# Patient Record
Sex: Female | Born: 2000 | Race: Black or African American | Hispanic: No | Marital: Single | Smoking: Never smoker
Health system: Southern US, Community
[De-identification: ages and names within clinical notes are randomized; demographics above are authoritative.]

## PROBLEM LIST (undated history)

## (undated) DIAGNOSIS — L309 Dermatitis, unspecified: Secondary | ICD-10-CM

## (undated) DIAGNOSIS — L509 Urticaria, unspecified: Secondary | ICD-10-CM

## (undated) HISTORY — DX: Urticaria, unspecified: L50.9

## (undated) HISTORY — DX: Dermatitis, unspecified: L30.9

---
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
## 2019-04-01 ENCOUNTER — Other Ambulatory Visit: Payer: Self-pay

## 2019-04-01 ENCOUNTER — Emergency Department (HOSPITAL_COMMUNITY): Payer: No Typology Code available for payment source

## 2019-04-01 ENCOUNTER — Emergency Department (HOSPITAL_COMMUNITY)
Admission: EM | Admit: 2019-04-01 | Discharge: 2019-04-02 | Disposition: A | Discharge status: Home / Self Care | Payer: No Typology Code available for payment source | Attending: Emergency Medicine | Admitting: Emergency Medicine

## 2019-04-01 ENCOUNTER — Encounter (HOSPITAL_COMMUNITY): Payer: Self-pay | Admitting: Emergency Medicine

## 2019-04-01 DIAGNOSIS — M79602 Pain in left arm: Secondary | ICD-10-CM | POA: Diagnosis not present

## 2019-04-01 DIAGNOSIS — Y92410 Unspecified street and highway as the place of occurrence of the external cause: Secondary | ICD-10-CM | POA: Diagnosis not present

## 2019-04-01 DIAGNOSIS — Y9389 Activity, other specified: Secondary | ICD-10-CM | POA: Diagnosis not present

## 2019-04-01 DIAGNOSIS — S0990XA Unspecified injury of head, initial encounter: Secondary | ICD-10-CM | POA: Diagnosis present

## 2019-04-01 DIAGNOSIS — Y999 Unspecified external cause status: Secondary | ICD-10-CM | POA: Insufficient documentation

## 2019-04-01 DIAGNOSIS — S0003XA Contusion of scalp, initial encounter: Secondary | ICD-10-CM

## 2019-04-01 MED ORDER — METOCLOPRAMIDE HCL 10 MG PO TABS
10.0000 mg | ORAL_TABLET | Freq: Once | ORAL | Status: AC
Start: 2019-04-01 — End: 2019-04-01
  Administered 2019-04-01: 23:00:00 10 mg via ORAL
  Filled 2019-04-01: qty 1

## 2019-04-01 MED ORDER — NAPROXEN 250 MG PO TABS
500.0000 mg | ORAL_TABLET | Freq: Once | ORAL | Status: AC
  Administered 2019-04-01: 23:00:00 500 mg via ORAL
  Filled 2019-04-01: qty 2
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
## 2019-04-02 NOTE — Discharge Instructions (Addendum)
You had a normal neurologic exam while in the emergency department.  This is reassuring.  It is possible that you may have a mild concussion.  A concussion is a diagnosis that is made clinically and does not show any abnormal results on imaging such as a CT scan or MRI.  Your CT today showed a hematoma to your scalp. This will improve with icing and time. Ibuprofen or tylenol may help improve your pain. Take as prescribed on the box/bottle.  A concussion may cause a persistent headache over the next few days. This can be brought on or worsened by loud sounds or bright lights. Try to avoid excessive use of cell phones, television, video games as this may worsening headaches.  Avoid strenuous activity and heavy lifting over the next few days.  If you develop severe worsening of your headache, vision changes or loss, uncontrolled vomiting, numbness or tingling to one side of your body, difficulty walking or lifting your arms or legs, return promptly to the emergency department for repeat evaluation.

## 2019-04-03 ENCOUNTER — Encounter (HOSPITAL_COMMUNITY): Payer: Self-pay | Admitting: Emergency Medicine

## 2019-04-03 ENCOUNTER — Other Ambulatory Visit: Payer: Self-pay

## 2019-04-03 ENCOUNTER — Emergency Department (HOSPITAL_COMMUNITY)
Admission: EM | Admit: 2019-04-03 | Discharge: 2019-04-03 | Disposition: A | Discharge status: Home / Self Care | Payer: PRIVATE HEALTH INSURANCE | Attending: Emergency Medicine | Admitting: Emergency Medicine

## 2019-04-03 ENCOUNTER — Emergency Department (HOSPITAL_COMMUNITY): Payer: PRIVATE HEALTH INSURANCE

## 2019-04-03 DIAGNOSIS — M546 Pain in thoracic spine: Secondary | ICD-10-CM | POA: Insufficient documentation

## 2019-04-03 DIAGNOSIS — M545 Low back pain: Secondary | ICD-10-CM | POA: Insufficient documentation

## 2019-04-03 DIAGNOSIS — Y999 Unspecified external cause status: Secondary | ICD-10-CM | POA: Insufficient documentation

## 2019-04-03 DIAGNOSIS — Y9389 Activity, other specified: Secondary | ICD-10-CM | POA: Diagnosis not present

## 2019-04-03 DIAGNOSIS — Y92410 Unspecified street and highway as the place of occurrence of the external cause: Secondary | ICD-10-CM | POA: Diagnosis not present

## 2019-04-03 DIAGNOSIS — R079 Chest pain, unspecified: Secondary | ICD-10-CM | POA: Diagnosis not present

## 2019-04-03 MED ORDER — METHOCARBAMOL 500 MG PO TABS: 500.0000 mg | ORAL_TABLET | Freq: Two times a day (BID) | ORAL | 0 refills | Status: DC

## 2019-04-03 MED ORDER — NAPROXEN 500 MG PO TABS: 500.0000 mg | ORAL_TABLET | Freq: Two times a day (BID) | ORAL | 0 refills | Status: DC

## 2019-04-03 NOTE — Discharge Instructions (Signed)
Continue the medications as prescribed. Return to the ED for worsening symptoms, increased chest pain, leg swelling, losing control of your bowels or bladder injuries or falls.
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
## 2019-07-12 ENCOUNTER — Encounter (HOSPITAL_COMMUNITY): Payer: Self-pay

## 2019-07-12 ENCOUNTER — Ambulatory Visit (HOSPITAL_COMMUNITY)
Admission: EM | Admit: 2019-07-12 | Discharge: 2019-07-12 | Disposition: A | Discharge status: Home / Self Care | Payer: Self-pay | Attending: Family Medicine | Admitting: Family Medicine

## 2019-07-12 ENCOUNTER — Other Ambulatory Visit: Payer: Self-pay

## 2019-07-12 DIAGNOSIS — R030 Elevated blood-pressure reading, without diagnosis of hypertension: Secondary | ICD-10-CM

## 2019-07-12 DIAGNOSIS — K0889 Other specified disorders of teeth and supporting structures: Secondary | ICD-10-CM

## 2019-07-12 MED ORDER — HYDROCODONE-ACETAMINOPHEN 5-325 MG PO TABS: 1.0000 | ORAL_TABLET | Freq: Four times a day (QID) | ORAL | 0 refills | Status: DC | PRN

## 2019-07-12 MED ORDER — PENICILLIN V POTASSIUM 500 MG PO TABS: 500.0000 mg | ORAL_TABLET | Freq: Three times a day (TID) | ORAL | 0 refills | Status: DC

## 2019-07-12 NOTE — Discharge Instructions (Addendum)
Be aware, you have been prescribed pain medications that may cause drowsiness. While taking this medication, do not take any other medications containing acetaminophen (Tylenol). Do not combine with alcohol or other illicit drugs. Please do not drive, operate heavy machinery, or take part in activities that require making important decisions while on this medication as your judgement may be clouded.  Your blood pressure was noted to be elevated during your visit today. You may return here within the next few days to recheck if unable to see your primary care doctor. If your blood pressure remains persistently elevated, you may need to begin taking a medication.  BP (!) 149/91 (BP Location: Right Arm)   Pulse 78   Temp 98.6 F (37 C) (Oral)   Resp 18   SpO2 99%

## 2019-07-12 NOTE — ED Triage Notes (Signed)
Patient presents to Urgent Care with complaints of right sided dental pain since the past few days. Patient reports she has a dental appt in the morning but cannot wait that long for relief.

## 2019-07-13 NOTE — ED Provider Notes (Signed)
[place]   [id number] 07/12/19 Arrival Time: 3474  ASSESSMENT & PLAN:  1. Pain, dental   2. Elevated blood pressure reading without diagnosis of hypertension     No sign of abscess requiring I&D at this time. Discussed.  Meds ordered this encounter  Medications  . penicillin v potassium (VEETID) 500 MG tablet    Sig: Take 1 tablet (500 mg total) by mouth 3 (three) times daily.    Dispense:  30 tablet    Refill:  0  . HYDROcodone-acetaminophen (NORCO/VICODIN) 5-325 MG tablet    Sig: Take 1 tablet by mouth every 6 (six) hours as needed for moderate pain or severe pain.    Dispense:  6 tablet    Refill:  0    [place] Controlled Substances Registry consulted for this patient. I feel the risk/benefit ratio today is favorable for proceeding with this prescription for a controlled substance. Medication sedation precautions given.  Dental resource written instructions given. She will schedule dental evaluation as soon as possible if not improving over the next 24-48 hours.    Discharge Instructions     Be aware, you have been prescribed pain medications that may cause drowsiness. While taking this medication, do not take any other medications containing acetaminophen (Tylenol). Do not combine with alcohol or other illicit drugs. Please do not drive, operate heavy machinery, or take part in activities that require making important decisions while on this medication as your judgement may be clouded.  Your blood pressure was noted to be elevated during your visit today. You may return here within the next few days to recheck if unable to see your primary care doctor. If your blood pressure remains persistently elevated, you may need to begin taking a medication.  BP (!) 149/91 (BP Location: Right Arm)   Pulse 78   Temp 98.6 F (37 C) (Oral)   Resp 18   SpO2 99%       Reviewed expectations re: course of current medical issues. Questions answered. Outlined signs and  symptoms indicating need for more acute intervention. Patient verbalized understanding. After Visit Summary given.   SUBJECTIVE:  Nichole Garrett is a 19 y.o. female who reports gradual onset of right lower dental pain described as aching/throbbing. Present for few days. Fever: absent. Tolerating PO intake but reports pain with chewing. Normal swallowing. She does not see a dentist regularly. No neck swelling or pain. OTC analgesics without relief.  Increased blood pressure noted today. Reports that she has not been treated for hypertension in the past.  She reports no chest pain on exertion, no dyspnea on exertion, no swelling of ankles, no orthostatic dizziness or lightheadedness, no orthopnea or paroxysmal nocturnal dyspnea, no palpitations and no intermittent claudication symptoms.   OBJECTIVE: Vitals:   07/12/19 1333  BP: (!) 149/91  Pulse: 78  Resp: 18  Temp: 98.6 F (37 C)  TempSrc: Oral  SpO2: 99%    General appearance: alert; no distress HENT: normocephalic; atraumatic; dentition: fair; right lower gums without areas of fluctuance, drainage, or bleeding and with tenderness to palpation; normal jaw movement without difficulty Neck: supple without LAD; FROM; trachea midline Lungs: normal respirations; unlabored Skin: warm and dry Psychological: alert and cooperative; normal mood and affect  No Known Allergies  History reviewed. No pertinent past medical history.   Social History   Socioeconomic History  . Marital status: Single    Spouse name: Not on file  . Number of children: Not on file  .  Years of education: Not on file  . Highest education level: Not on file  Occupational History  . Not on file  Tobacco Use  . Smoking status: Never Smoker  . Smokeless tobacco: Never Used  Substance and Sexual Activity  . Alcohol use: Never  . Drug use: Never  . Sexual activity: Not on file  Other Topics Concern  . Not on file  Social History Narrative  . Not  on file   Social Determinants of Health   Financial Resource Strain:   . Difficulty of Paying Living Expenses: Not on file  Food Insecurity:   . Worried About Programme researcher, broadcasting/film/video in the Last Year: Not on file  . Ran Out of Food in the Last Year: Not on file  Transportation Needs:   . Lack of Transportation (Medical): Not on file  . Lack of Transportation (Non-Medical): Not on file  Physical Activity:   . Days of Exercise per Week: Not on file  . Minutes of Exercise per Session: Not on file  Stress:   . Feeling of Stress : Not on file  Social Connections:   . Frequency of Communication with Friends and Family: Not on file  . Frequency of Social Gatherings with Friends and Family: Not on file  . Attends Religious Services: Not on file  . Active Member of Clubs or Organizations: Not on file  . Attends Banker Meetings: Not on file  . Marital Status: Not on file  Intimate Partner Violence:   . Fear of Current or Ex-Partner: Not on file  . Emotionally Abused: Not on file  . Physically Abused: Not on file  . Sexually Abused: Not on file   Family History  Problem Relation Age of Onset  . Healthy Mother   . Healthy Father    History reviewed. No pertinent surgical history.   Mardella Layman, MD 07/13/19 [phone]

## 2020-05-21 ENCOUNTER — Encounter (HOSPITAL_COMMUNITY): Payer: Self-pay | Admitting: Emergency Medicine

## 2020-05-21 ENCOUNTER — Emergency Department (HOSPITAL_COMMUNITY)
Admission: EM | Admit: 2020-05-21 | Discharge: 2020-05-22 | Disposition: A | Discharge status: Home / Self Care | Payer: HRSA Program | Attending: Emergency Medicine | Admitting: Emergency Medicine

## 2020-05-21 ENCOUNTER — Emergency Department (HOSPITAL_COMMUNITY): Payer: HRSA Program

## 2020-05-21 ENCOUNTER — Other Ambulatory Visit: Payer: Self-pay

## 2020-05-21 DIAGNOSIS — U071 COVID-19: Secondary | ICD-10-CM | POA: Insufficient documentation

## 2020-05-21 DIAGNOSIS — R059 Cough, unspecified: Secondary | ICD-10-CM | POA: Diagnosis present

## 2020-05-21 NOTE — ED Triage Notes (Signed)
Patient presents with multiple complaints : fatigue , fever , headache , low back pain and hot flashes onset this week .

## 2020-05-22 LAB — RESP PANEL BY RT-PCR (FLU A&B, COVID) ARPGX2
Influenza A by PCR: NEGATIVE
Influenza B by PCR: NEGATIVE
SARS Coronavirus 2 by RT PCR: POSITIVE — AB

## 2020-05-22 MED ORDER — BENZONATATE 100 MG PO CAPS
200.0000 mg | ORAL_CAPSULE | Freq: Once | ORAL | Status: AC
  Administered 2020-05-22: 200 mg via ORAL
  Filled 2020-05-22: qty 2

## 2020-05-22 MED ORDER — ACETAMINOPHEN 325 MG PO TABS
650.0000 mg | ORAL_TABLET | Freq: Once | ORAL | Status: AC
  Administered 2020-05-22: 650 mg via ORAL
  Filled 2020-05-22: qty 2

## 2020-05-22 MED ORDER — BENZONATATE 100 MG PO CAPS: 100.0000 mg | ORAL_CAPSULE | Freq: Three times a day (TID) | ORAL | 0 refills | Status: DC

## 2020-05-22 MED ORDER — NAPROXEN 375 MG PO TABS: 375.0000 mg | ORAL_TABLET | Freq: Two times a day (BID) | ORAL | 0 refills | Status: DC

## 2020-05-22 MED ORDER — ONDANSETRON 8 MG PO TBDP: 8.0000 mg | ORAL_TABLET | Freq: Three times a day (TID) | ORAL | 0 refills | Status: DC | PRN

## 2020-05-22 MED ORDER — ONDANSETRON 4 MG PO TBDP
4.0000 mg | ORAL_TABLET | Freq: Once | ORAL | Status: AC
  Administered 2020-05-22: 4 mg via ORAL
  Filled 2020-05-22: qty 1

## 2020-05-22 NOTE — Progress Notes (Addendum)
CSW submitted a referral to the [facility] Department for the COVID hotel. Referral was accepted and the patient will go to room 145 at the [facility]. Patient was given referral paperwork. Patient to utilize her personal vehicle for transportation to the hotel.  April, RN aware of discharge plan.   Edwin Dada, MSW, LCSW-A Transitions of Care  Clinical Social Worker [facility] Emergency Departments  Medical ICU [phone]

## 2020-05-22 NOTE — ED Notes (Signed)
Patient provided with welcome packet for [facility] COVID-19 shelter. Patient providing own transportation to hotel. Patient verbalized understanding of discharge instructions, no further questions for staff at this time.
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
## 2020-05-22 NOTE — Discharge Instructions (Signed)
Take the medications as needed for your symptoms.  It may take a week or 2 before the infection completely resolves.  Please return to the ED if you start having trouble with shortness of breath, difficulty breathing.

## 2020-08-13 DIAGNOSIS — Z3046 Encounter for surveillance of implantable subdermal contraceptive: Secondary | ICD-10-CM | POA: Diagnosis not present

## 2020-09-26 DIAGNOSIS — Z1152 Encounter for screening for COVID-19: Secondary | ICD-10-CM | POA: Diagnosis not present

## 2020-10-04 DIAGNOSIS — Z1152 Encounter for screening for COVID-19: Secondary | ICD-10-CM | POA: Diagnosis not present

## 2021-04-17 DIAGNOSIS — Z1152 Encounter for screening for COVID-19: Secondary | ICD-10-CM | POA: Diagnosis not present

## 2021-04-30 DIAGNOSIS — Z1152 Encounter for screening for COVID-19: Secondary | ICD-10-CM | POA: Diagnosis not present

## 2021-05-23 DIAGNOSIS — Z1152 Encounter for screening for COVID-19: Secondary | ICD-10-CM | POA: Diagnosis not present

## 2021-06-10 ENCOUNTER — Ambulatory Visit
Admission: EM | Admit: 2021-06-10 | Discharge: 2021-06-10 | Disposition: A | Discharge status: Home / Self Care | Payer: No Typology Code available for payment source | Attending: Internal Medicine | Admitting: Internal Medicine

## 2021-06-10 ENCOUNTER — Other Ambulatory Visit: Payer: Self-pay

## 2021-06-10 ENCOUNTER — Encounter: Payer: Self-pay | Admitting: Emergency Medicine

## 2021-06-10 DIAGNOSIS — R5383 Other fatigue: Secondary | ICD-10-CM

## 2021-06-10 LAB — POCT URINALYSIS DIP (MANUAL ENTRY)
Bilirubin, UA: NEGATIVE
Blood, UA: NEGATIVE
Glucose, UA: NEGATIVE mg/dL
Leukocytes, UA: NEGATIVE
Nitrite, UA: NEGATIVE
Protein Ur, POC: 30 mg/dL — AB
Spec Grav, UA: 1.025 (ref 1.010–1.025)
Urobilinogen, UA: 1 U/dL
pH, UA: 6 (ref 5.0–8.0)

## 2021-06-10 LAB — POCT FASTING CBG KUC MANUAL ENTRY: POCT Glucose (KUC): 99 mg/dL (ref 70–99)

## 2021-06-10 LAB — POCT URINE PREGNANCY: Preg Test, Ur: NEGATIVE

## 2021-06-10 NOTE — ED Provider Notes (Signed)
[facility]    CSN: [id number] Arrival date & time: 06/10/21  1034      History   Chief Complaint Chief Complaint  Patient presents with   Fatigue    HPI Nichole Garrett is a 21 y.o. female.   Patient presents with generalized fatigue that has been present for multiple weeks.  Patient denies any other associated symptoms including upper respiratory symptoms, fever, cough, chest pain, shortness of breath, headache, blurred vision, nausea, vomiting, diarrhea, abdominal pain, constipation.  She reports that she does have associated dizziness at times.  Denies any blood in stool.  Denies urinary burning, frequency, vaginal discharge, hematuria, irregular vaginal bleeding, pelvic pain, back pain.  Denies any known sick exposures.  Last menstrual cycle was last month, but the patient is not sure of exact date.  Patient has been off birth control for a few months, so she reports that her menstrual cycles have been irregular.  Denies any past medical history and denies that she takes any daily prescription medicines.    History reviewed. No pertinent past medical history.  Patient Active Problem List   Diagnosis Date Noted   SNORING, HX OF 03/09/2007    History reviewed. No pertinent surgical history.  OB History   No obstetric history on file.      Home Medications    Prior to Admission medications   Medication Sig Start Date End Date Taking? Authorizing Provider  benzonatate (TESSALON) 100 MG capsule Take 1 capsule (100 mg total) by mouth every 8 (eight) hours. 05/22/20   Dorie Rank, MD  HYDROcodone-acetaminophen (NORCO/VICODIN) 5-325 MG tablet Take 1 tablet by mouth every 6 (six) hours as needed for moderate pain or severe pain. 07/12/19   Vanessa Kick, MD  methocarbamol (ROBAXIN) 500 MG tablet Take 1 tablet (500 mg total) by mouth 2 (two) times daily. 04/03/19   Khatri, Hina, PA-C  naproxen (NAPROSYN) 375 MG tablet Take 1 tablet (375 mg total) by mouth 2 (two)  times daily. 05/22/20   Dorie Rank, MD  ondansetron (ZOFRAN ODT) 8 MG disintegrating tablet Take 1 tablet (8 mg total) by mouth every 8 (eight) hours as needed for nausea or vomiting. 05/22/20   Dorie Rank, MD  penicillin v potassium (VEETID) 500 MG tablet Take 1 tablet (500 mg total) by mouth 3 (three) times daily. 07/12/19   Vanessa Kick, MD    Family History Family History  Problem Relation Age of Onset   Healthy Mother    Healthy Father     Social History Social History   Tobacco Use   Smoking status: Never   Smokeless tobacco: Never  Substance Use Topics   Alcohol use: Never   Drug use: Never     Allergies   Patient has no known allergies.   Review of Systems Review of Systems Per HPI  Physical Exam Triage Vital Signs ED Triage Vitals  Enc Vitals Group     BP 06/10/21 1056 124/90     Pulse Rate 06/10/21 1056 93     Resp 06/10/21 1056 16     Temp 06/10/21 1056 98.6 F (37 C)     Temp Source 06/10/21 1056 Oral     SpO2 06/10/21 1056 98 %     Weight --      Height --      Head Circumference --      Peak Flow --      Pain Score 06/10/21 1055 0     Pain Loc --  Pain Edu? --      Excl. in [place]? --    No data found.  Updated Vital Signs BP 124/90 (BP Location: Left Arm)    Pulse 93    Temp 98.6 F (37 C) (Oral)    Resp 16    SpO2 98%   Visual Acuity Right Eye Distance:   Left Eye Distance:   Bilateral Distance:    Right Eye Near:   Left Eye Near:    Bilateral Near:     Physical Exam Constitutional:      General: She is not in acute distress.    Appearance: Normal appearance. She is not toxic-appearing or diaphoretic.  HENT:     Head: Normocephalic and atraumatic.     Right Ear: Tympanic membrane and ear canal normal.     Left Ear: Tympanic membrane and ear canal normal.     Nose: Nose normal.     Mouth/Throat:     Mouth: Mucous membranes are moist.     Pharynx: No posterior oropharyngeal erythema.  Eyes:     Extraocular Movements:  Extraocular movements intact.     Conjunctiva/sclera: Conjunctivae normal.     Pupils: Pupils are equal, round, and reactive to light.  Cardiovascular:     Rate and Rhythm: Normal rate and regular rhythm.     Pulses: Normal pulses.     Heart sounds: Normal heart sounds.  Pulmonary:     Effort: Pulmonary effort is normal. No respiratory distress.     Breath sounds: Normal breath sounds.  Abdominal:     General: Bowel sounds are normal. There is no distension.     Tenderness: There is no abdominal tenderness.  Musculoskeletal:     Cervical back: Normal range of motion.  Skin:    General: Skin is warm and dry.  Neurological:     General: No focal deficit present.     Mental Status: She is alert and oriented to person, place, and time. Mental status is at baseline.     Cranial Nerves: Cranial nerves 2-12 are intact.     Sensory: Sensation is intact.     Motor: Motor function is intact.     Coordination: Coordination is intact.     Gait: Gait is intact.  Psychiatric:        Mood and Affect: Mood normal.        Behavior: Behavior normal.        Thought Content: Thought content normal.        Judgment: Judgment normal.     UC Treatments / Results  Labs (all labs ordered are listed, but only abnormal results are displayed) Labs Reviewed  POCT URINALYSIS DIP (MANUAL ENTRY) - Abnormal; Notable for the following components:      Result Value   Ketones, POC UA large (80) (*)    Protein Ur, POC =30 (*)    All other components within normal limits  CBC  COMPREHENSIVE METABOLIC PANEL  TSH  POCT URINE PREGNANCY  POCT FASTING CBG [facility] MANUAL ENTRY    EKG   Radiology No results found.  Procedures Procedures (including critical care time)  Medications Ordered in UC Medications - No data to display  Initial Impression / Assessment and Plan / UC Course  I have reviewed the triage vital signs and the nursing notes.  Pertinent labs & imaging results that were available during  my care of the patient were reviewed by me and considered in my medical decision making (see chart  for details).     There are no obvious abnormalities on physical exam that can indicate cause of patient's fatigue.  Urine pregnancy was negative.  Urinalysis not definitive for urinary tract infection, although ketones were present on urinalysis.  Blood glucose completed due to this.  Blood glucose was 99 today.  EKG was normal sinus rhythm.  CMP, CBC, TSH pending to rule out other etiologies of fatigue.  Patient does not have PCP.  PCP assistance requested for patient as patient will need to follow-up with PCP for further evaluation and management of fatigue.  Discussed strict return and ER precautions.  Patient verbalized understanding and was agreeable with plan. Final Clinical Impressions(s) / UC Diagnoses   Final diagnoses:  Other fatigue     Discharge Instructions      Your EKG was normal.  Urine pregnancy was negative.  Urinalysis was normal.  Blood glucose was normal.  Your blood work is pending.  We will call you if there are any abnormalities.  Please follow-up with primary care physician for further evaluation and management.    ED Prescriptions   None    PDMP not reviewed this encounter.   Teodora Medici, [place] 06/10/21 [phone]

## 2021-06-10 NOTE — Discharge Instructions (Signed)
Your EKG was normal.  Urine pregnancy was negative.  Urinalysis was normal.  Blood glucose was normal.  Your blood work is pending.  We will call you if there are any abnormalities.  Please follow-up with primary care physician for further evaluation and management.

## 2021-06-10 NOTE — ED Triage Notes (Addendum)
C/o new generalized fatigue starting a couple weeks ago, and right arm fatigue starting months before that. Denies pain. States she's unsure if she's pregnant, not currently on birth control

## 2021-06-11 LAB — CBC
Hematocrit: 38.7 % (ref 34.0–46.6)
Hemoglobin: 13.3 g/dL (ref 11.1–15.9)
MCH: 31.2 pg (ref 26.6–33.0)
MCHC: 34.4 g/dL (ref 31.5–35.7)
MCV: 91 fL (ref 79–97)
Platelets: 320 10*3/uL (ref 150–450)
RBC: 4.26 x10E6/uL (ref 3.77–5.28)
RDW: 13.4 % (ref 11.7–15.4)
WBC: 5.5 10*3/uL (ref 3.4–10.8)

## 2021-06-11 LAB — COMPREHENSIVE METABOLIC PANEL
ALT: 10 IU/L (ref 0–32)
AST: 13 IU/L (ref 0–40)
Albumin/Globulin Ratio: 1.2 (ref 1.2–2.2)
Albumin: 4.2 g/dL (ref 3.9–5.0)
Alkaline Phosphatase: 54 IU/L (ref 44–121)
BUN/Creatinine Ratio: 12 (ref 9–23)
BUN: 11 mg/dL (ref 6–20)
Bilirubin Total: 1.1 mg/dL (ref 0.0–1.2)
CO2: 19 mmol/L — ABNORMAL LOW (ref 20–29)
Calcium: 9.1 mg/dL (ref 8.7–10.2)
Chloride: 105 mmol/L (ref 96–106)
Creatinine, Ser: 0.9 mg/dL (ref 0.57–1.00)
Globulin, Total: 3.5 g/dL (ref 1.5–4.5)
Glucose: 91 mg/dL (ref 70–99)
Potassium: 4.2 mmol/L (ref 3.5–5.2)
Sodium: 137 mmol/L (ref 134–144)
Total Protein: 7.7 g/dL (ref 6.0–8.5)
eGFR: 93 mL/min/{1.73_m2} (ref >59)

## 2021-06-11 LAB — TSH: TSH: 0.955 u[IU]/mL (ref 0.450–4.500)

## 2021-06-15 DIAGNOSIS — Z1152 Encounter for screening for COVID-19: Secondary | ICD-10-CM | POA: Diagnosis not present

## 2021-06-20 DIAGNOSIS — Z1152 Encounter for screening for COVID-19: Secondary | ICD-10-CM | POA: Diagnosis not present

## 2021-06-27 DIAGNOSIS — Z1152 Encounter for screening for COVID-19: Secondary | ICD-10-CM | POA: Diagnosis not present

## 2021-07-18 DIAGNOSIS — Z1152 Encounter for screening for COVID-19: Secondary | ICD-10-CM | POA: Diagnosis not present

## 2021-07-21 DIAGNOSIS — Z20822 Contact with and (suspected) exposure to covid-19: Secondary | ICD-10-CM | POA: Diagnosis not present

## 2021-07-25 DIAGNOSIS — Z1152 Encounter for screening for COVID-19: Secondary | ICD-10-CM | POA: Diagnosis not present

## 2021-07-28 DIAGNOSIS — Z20822 Contact with and (suspected) exposure to covid-19: Secondary | ICD-10-CM | POA: Diagnosis not present

## 2021-07-29 ENCOUNTER — Ambulatory Visit: Payer: Medicaid Other | Admitting: Family Medicine

## 2021-08-04 DIAGNOSIS — Z1152 Encounter for screening for COVID-19: Secondary | ICD-10-CM | POA: Diagnosis not present

## 2021-08-05 ENCOUNTER — Ambulatory Visit: Payer: Medicaid Other | Admitting: Nurse Practitioner

## 2021-08-17 DIAGNOSIS — Z1152 Encounter for screening for COVID-19: Secondary | ICD-10-CM | POA: Diagnosis not present

## 2021-08-24 DIAGNOSIS — Z1152 Encounter for screening for COVID-19: Secondary | ICD-10-CM | POA: Diagnosis not present

## 2021-09-28 DIAGNOSIS — Z1152 Encounter for screening for COVID-19: Secondary | ICD-10-CM | POA: Diagnosis not present

## 2022-04-27 ENCOUNTER — Ambulatory Visit (HOSPITAL_COMMUNITY)
Admission: EM | Admit: 2022-04-27 | Discharge: 2022-04-27 | Disposition: A | Discharge status: Home / Self Care | Payer: Self-pay | Attending: Internal Medicine | Admitting: Internal Medicine

## 2022-04-27 ENCOUNTER — Ambulatory Visit (INDEPENDENT_AMBULATORY_CARE_PROVIDER_SITE_OTHER): Payer: Self-pay

## 2022-04-27 ENCOUNTER — Encounter (HOSPITAL_COMMUNITY): Payer: Self-pay

## 2022-04-27 DIAGNOSIS — M25561 Pain in right knee: Secondary | ICD-10-CM

## 2022-04-27 DIAGNOSIS — M7651 Patellar tendinitis, right knee: Secondary | ICD-10-CM

## 2022-04-27 MED ORDER — PREDNISONE 10 MG PO TABS: ORAL_TABLET | ORAL | 0 refills | Status: DC

## 2022-04-27 NOTE — ED Provider Notes (Signed)
[facility]    CSN: [id number] Arrival date & time: 04/27/22  1154      History   Chief Complaint Chief Complaint  Patient presents with   Knee Pain    HPI Nichole Garrett is a 21 y.o. female presents to UC today with complaint of right knee pain and swelling.  She reports this started 3 weeks ago.  She describes the pain as sharp and stabbing.  The pain is worse with ambulating.  She does feel like her right knee is going to give out on her at times.  She denies any numbness, tingling or weakness of the right lower extremity.  She denies any injury to the area.  She has tried ice, Ibuprofen and Tylenol OTC with minimal relief of symptoms.  HPI  History reviewed. No pertinent past medical history.  Patient Active Problem List   Diagnosis Date Noted   SNORING, HX OF 03/09/2007    History reviewed. No pertinent surgical history.  OB History   No obstetric history on file.      Home Medications    Prior to Admission medications   Medication Sig Start Date End Date Taking? Authorizing Provider  predniSONE (DELTASONE) 10 MG tablet Take 3 tabs on days 1-3, 2 tabs on days 4-6, 1 tab on days 7-9 04/27/22  Yes Jamarkis Branam, Salvadore Oxford, NP  benzonatate (TESSALON) 100 MG capsule Take 1 capsule (100 mg total) by mouth every 8 (eight) hours. 05/22/20   Linwood Dibbles, MD  HYDROcodone-acetaminophen (NORCO/VICODIN) 5-325 MG tablet Take 1 tablet by mouth every 6 (six) hours as needed for moderate pain or severe pain. 07/12/19   Mardella Layman, MD  methocarbamol (ROBAXIN) 500 MG tablet Take 1 tablet (500 mg total) by mouth 2 (two) times daily. 04/03/19   Khatri, Hina, PA-C  naproxen (NAPROSYN) 375 MG tablet Take 1 tablet (375 mg total) by mouth 2 (two) times daily. 05/22/20   Linwood Dibbles, MD  ondansetron (ZOFRAN ODT) 8 MG disintegrating tablet Take 1 tablet (8 mg total) by mouth every 8 (eight) hours as needed for nausea or vomiting. 05/22/20   Linwood Dibbles, MD  penicillin v potassium  (VEETID) 500 MG tablet Take 1 tablet (500 mg total) by mouth 3 (three) times daily. 07/12/19   Mardella Layman, MD    Family History Family History  Problem Relation Age of Onset   Healthy Mother    Healthy Father     Social History Social History   Tobacco Use   Smoking status: Never   Smokeless tobacco: Never  Substance Use Topics   Alcohol use: Never   Drug use: Never     Allergies   Patient has no known allergies.   Review of Systems Review of Systems  Respiratory:  Negative for cough, chest tightness and shortness of breath.   Cardiovascular:  Negative for chest pain.  Musculoskeletal:  Positive for arthralgias and joint swelling.     Physical Exam Triage Vital Signs ED Triage Vitals  Enc Vitals Group     BP 04/27/22 1336 115/80     Pulse Rate 04/27/22 1336 76     Resp 04/27/22 1336 16     Temp 04/27/22 1336 98.5 F (36.9 C)     Temp Source 04/27/22 1336 Oral     SpO2 04/27/22 1336 98 %     Weight --      Height --      Head Circumference --      Peak Flow --  Pain Score 04/27/22 1335 9     Pain Loc --      Pain Edu? --      Excl. in [place]? --    No data found.  Updated Vital Signs BP 115/80 (BP Location: Left Arm)   Pulse 76   Temp 98.5 F (36.9 C) (Oral)   Resp 16   LMP 04/04/2022 (Approximate)   SpO2 98%      Physical Exam Constitutional:      Appearance: Normal appearance.  Cardiovascular:     Rate and Rhythm: Normal rate.  Pulmonary:     Effort: Pulmonary effort is normal.  Musculoskeletal:        General: Swelling and tenderness present. Normal range of motion.     Comments: Swelling noted over the anterior knee.  Pain with palpation over the patellar tendon.  Neurological:     Mental Status: She is alert.      UC Treatments / Results   Radiology Imaging Orders         DG Knee 2 Views Right    IMPRESSION: Negative. Medications Ordered in UC Medications - No data to display  Initial Impression / Assessment and Plan /  UC Course  I have reviewed the triage vital signs and the nursing notes.  Pertinent labs & imaging results that were available during my care of the patient were reviewed by me and considered in my medical decision making (see chart for details).    21 year old female with complaint of right knee pain and swelling x3 weeks.  DDx include bursitis, patellar tendinitis and osteoarthritis.  X-ray right knee does not show any acute findings per my read, confirmed by radiology.  Exam consistent with patellar tendinitis.  Encouraged her to avoid overuse.  Rx for Prednisone taper x9 days.  Encouraged ice for 10 minutes 3 times daily.  Advised her to follow-up with her PCP if if symptoms persist or worsen.  Final Clinical Impressions(s) / UC Diagnoses   Final diagnoses:  Patellar tendinitis of right knee     Discharge Instructions      You were seen today for right knee pain.  Your x-ray did not show any findings.  You have been diagnosed with patellar tendinitis.  This is inflammation of the patellar tendon.  We treat this with steroids taken daily for 9 days.  You may apply ice for 10 minutes 3 times daily.  Please follow-up with your PCP or this department if symptoms persist or worsen.     ED Prescriptions     Medication Sig Dispense Auth. Provider   predniSONE (DELTASONE) 10 MG tablet Take 3 tabs on days 1-3, 2 tabs on days 4-6, 1 tab on days 7-9 18 tablet Ginelle Bays, Salvadore Oxford, NP      PDMP not reviewed this encounter.   Lorre Munroe, NP 04/27/22 1501

## 2022-04-27 NOTE — Discharge Instructions (Signed)
You were seen today for right knee pain.  Your x-ray did not show any findings.  You have been diagnosed with patellar tendinitis.  This is inflammation of the patellar tendon.  We treat this with steroids taken daily for 9 days.  You may apply ice for 10 minutes 3 times daily.  Please follow-up with your PCP or this department if symptoms persist or worsen.

## 2022-04-27 NOTE — ED Triage Notes (Signed)
Chief Complaint: right knee pain randomly. Patient denies any injuries or falls. States when bending down and then standing feels like the knee is going out.   Onset: 3 weeks.

## 2022-05-19 DIAGNOSIS — R051 Acute cough: Secondary | ICD-10-CM | POA: Diagnosis not present

## 2022-05-19 DIAGNOSIS — B349 Viral infection, unspecified: Secondary | ICD-10-CM | POA: Diagnosis not present

## 2022-06-26 ENCOUNTER — Telehealth: Payer: Self-pay

## 2022-06-26 NOTE — Telephone Encounter (Signed)
Mychart msg sent. AS, CMA 

## 2022-07-18 IMAGING — DX DG CHEST 1V PORT
1 series · 1 of 1 positions shown · non-contrast
Comparison: 04/03/2019

CLINICAL DATA: Shortness of breath, fever

EXAM:
PORTABLE CHEST 1 VIEW

[chest ap]
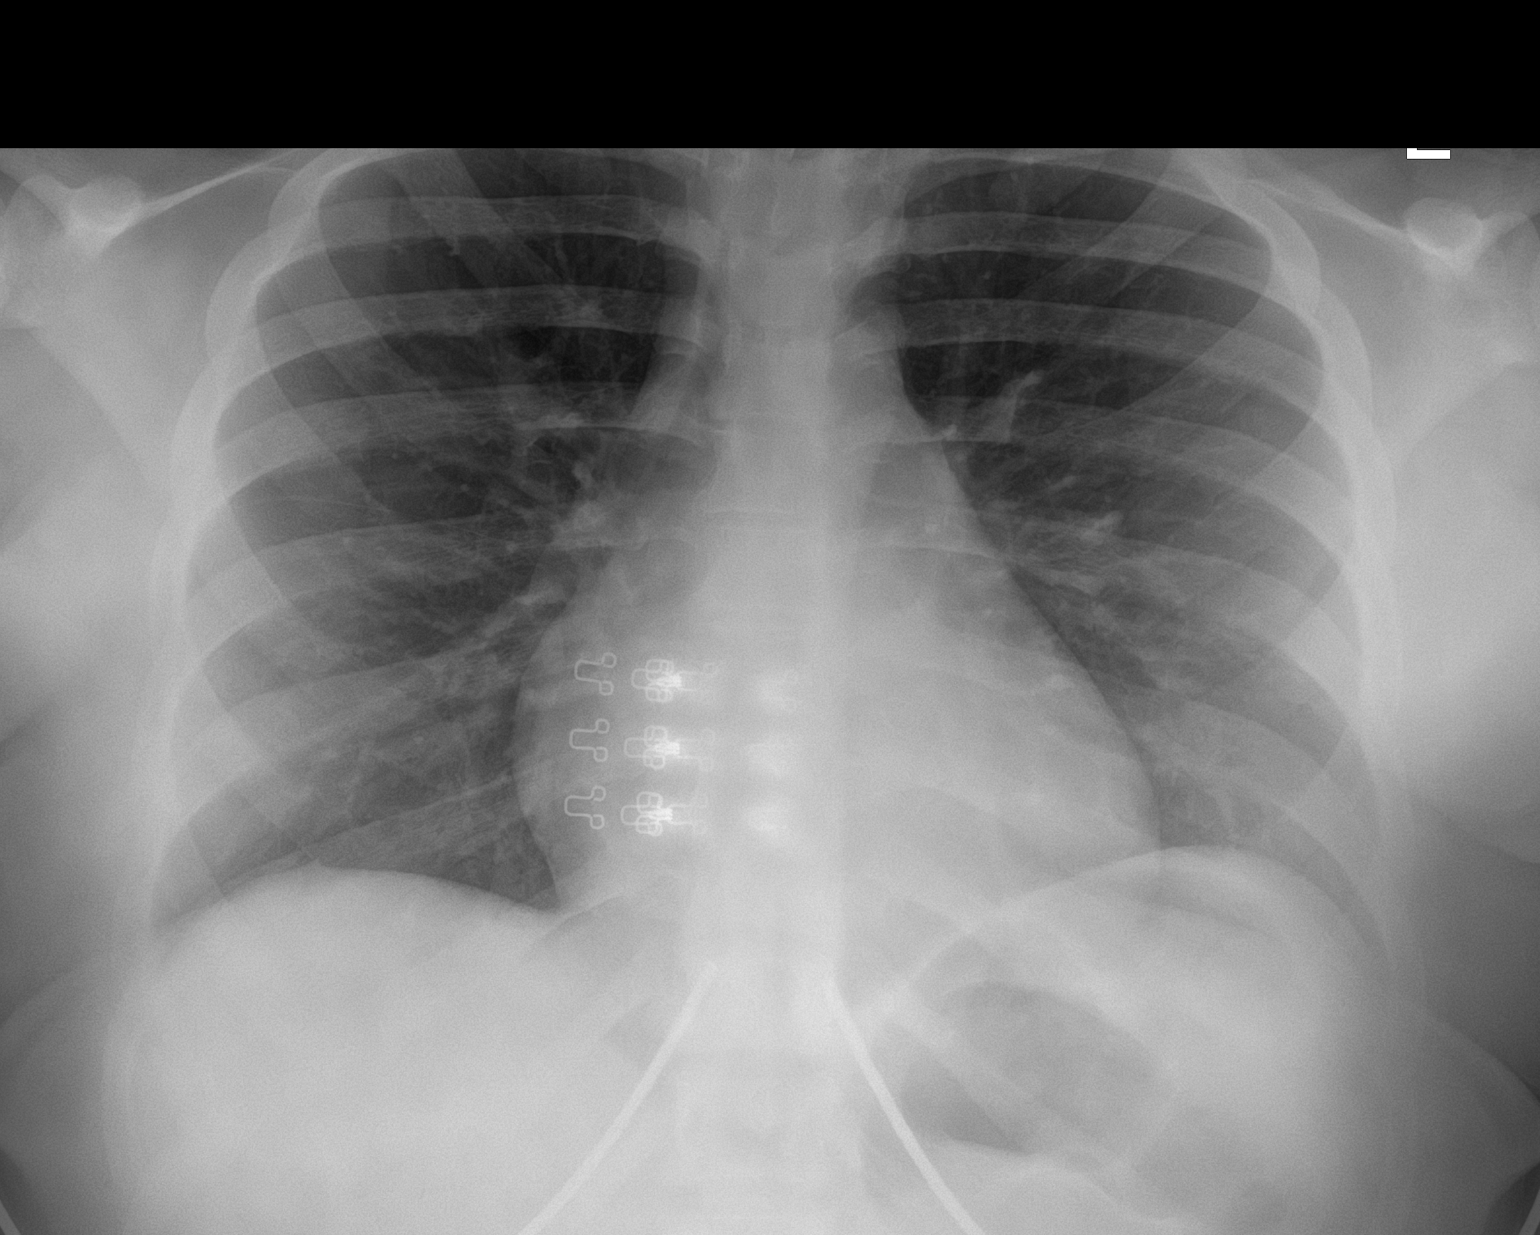

[1 of 1 positions shown; findings below may reference images not displayed]

FINDINGS: Low volumes and atelectasis. Additional streaky opacity in the left
lung base could reflect some vascular crowding or atelectatic change
versus early infection in the setting of fever. Lungs are otherwise
clear. No pneumothorax or effusion. No convincing features of edema.
The cardiomediastinal contours are unremarkable. No acute osseous or
soft tissue abnormality.
IMPRESSION: Low volumes and atelectasis. Streaky opacity in the left lung base
could reflect some vascular crowding with atelectatic change versus
early infection in the setting of fever.

## 2022-07-24 ENCOUNTER — Encounter: Payer: Medicaid Other | Admitting: Family Medicine

## 2022-07-24 NOTE — Progress Notes (Signed)
Patient is here to established care with provider today. Patient has many health concern they would like to discuss with provider today  Care gaps discuss at appointment today

## 2022-09-29 ENCOUNTER — Other Ambulatory Visit: Payer: Self-pay

## 2022-09-29 ENCOUNTER — Emergency Department (HOSPITAL_COMMUNITY): Payer: Medicaid Other

## 2022-09-29 ENCOUNTER — Emergency Department (HOSPITAL_COMMUNITY)
Admission: EM | Admit: 2022-09-29 | Discharge: 2022-09-29 | Disposition: A | Discharge status: Home / Self Care | Payer: Medicaid Other | Attending: Student | Admitting: Student

## 2022-09-29 DIAGNOSIS — H00011 Hordeolum externum right upper eyelid: Secondary | ICD-10-CM | POA: Insufficient documentation

## 2022-09-29 DIAGNOSIS — R519 Headache, unspecified: Secondary | ICD-10-CM | POA: Insufficient documentation

## 2022-09-29 DIAGNOSIS — Z1152 Encounter for screening for COVID-19: Secondary | ICD-10-CM | POA: Diagnosis not present

## 2022-09-29 DIAGNOSIS — R069 Unspecified abnormalities of breathing: Secondary | ICD-10-CM | POA: Diagnosis not present

## 2022-09-29 DIAGNOSIS — R42 Dizziness and giddiness: Secondary | ICD-10-CM | POA: Diagnosis not present

## 2022-09-29 DIAGNOSIS — H5710 Ocular pain, unspecified eye: Secondary | ICD-10-CM | POA: Diagnosis not present

## 2022-09-29 DIAGNOSIS — R609 Edema, unspecified: Secondary | ICD-10-CM | POA: Diagnosis not present

## 2022-09-29 DIAGNOSIS — R064 Hyperventilation: Secondary | ICD-10-CM | POA: Diagnosis not present

## 2022-09-29 DIAGNOSIS — R0602 Shortness of breath: Secondary | ICD-10-CM | POA: Diagnosis not present

## 2022-09-29 LAB — RESP PANEL BY RT-PCR (RSV, FLU A&B, COVID)  RVPGX2
Influenza A by PCR: NEGATIVE
Influenza B by PCR: NEGATIVE
Resp Syncytial Virus by PCR: NEGATIVE
SARS Coronavirus 2 by RT PCR: NEGATIVE

## 2022-09-29 LAB — PREGNANCY, URINE: Preg Test, Ur: NEGATIVE

## 2022-09-29 MED ORDER — NAPROXEN 250 MG PO TABS
500.0000 mg | ORAL_TABLET | Freq: Once | ORAL | Status: AC
  Administered 2022-09-29: 500 mg via ORAL
  Filled 2022-09-29: qty 2

## 2022-09-29 MED ORDER — ERYTHROMYCIN 5 MG/GM OP OINT
1.0000 | TOPICAL_OINTMENT | Freq: Once | OPHTHALMIC | Status: AC
  Administered 2022-09-29: 1 via OPHTHALMIC
  Filled 2022-09-29: qty 3.5

## 2022-09-29 MED ORDER — NAPROXEN 375 MG PO TABS: 375.0000 mg | ORAL_TABLET | Freq: Two times a day (BID) | ORAL | 0 refills | Status: DC

## 2022-09-29 NOTE — ED Provider Notes (Signed)
[place] [facility] AT [facility] Provider Note  CSN: [id number] Arrival date & time: 09/29/22 0343  Chief Complaint(s) Shortness of Breath and Dizziness  HPI Nichole Garrett is a 22 y.o. female who presents emergency department for evaluation of a headache and eye pain.  Patient reportedly called an ambulance today for shortness of breath and dizziness started approximately 1 AM this morning but here in the emergency department is not complaining of any shortness of breath or dizziness.  She states that pain is primarily on upper eyelid of the right eye with associated headache.  Denies numbness, tingling, weakness or other neurologic complaints.  Denies chest pain, shortness of breath, vomiting, diarrhea or other neurologic complaints.   Past Medical History No past medical history on file. Patient Active Problem List   Diagnosis Date Noted   SNORING, HX OF 03/09/2007   Home Medication(s) Prior to Admission medications   Medication Sig Start Date End Date Taking? Authorizing Provider  benzonatate (TESSALON) 100 MG capsule Take 1 capsule (100 mg total) by mouth every 8 (eight) hours. 05/22/20   Linwood Dibbles, MD  HYDROcodone-acetaminophen (NORCO/VICODIN) 5-325 MG tablet Take 1 tablet by mouth every 6 (six) hours as needed for moderate pain or severe pain. 07/12/19   Mardella Layman, MD  methocarbamol (ROBAXIN) 500 MG tablet Take 1 tablet (500 mg total) by mouth 2 (two) times daily. 04/03/19   Khatri, Hina, PA-C  naproxen (NAPROSYN) 375 MG tablet Take 1 tablet (375 mg total) by mouth 2 (two) times daily. 05/22/20   Linwood Dibbles, MD  naproxen (NAPROSYN) 375 MG tablet Take 1 tablet (375 mg total) by mouth 2 (two) times daily. 09/29/22   Kensington Rios, MD  ondansetron (ZOFRAN ODT) 8 MG disintegrating tablet Take 1 tablet (8 mg total) by mouth every 8 (eight) hours as needed for nausea or vomiting. 05/22/20   Linwood Dibbles, MD  penicillin v potassium (VEETID) 500 MG tablet  Take 1 tablet (500 mg total) by mouth 3 (three) times daily. 07/12/19   Mardella Layman, MD  predniSONE (DELTASONE) 10 MG tablet Take 3 tabs on days 1-3, 2 tabs on days 4-6, 1 tab on days 7-9 04/27/22   Lorre Munroe, NP                                                                                                                                    Past Surgical History No past surgical history on file. Family History Family History  Problem Relation Age of Onset   Healthy Mother    Healthy Father     Social History Social History   Tobacco Use   Smoking status: Never   Smokeless tobacco: Never  Substance Use Topics   Alcohol use: Never   Drug use: Never   Allergies Patient has no known allergies.  Review of Systems Review of Systems  Eyes:  Positive for pain.  Neurological:  Positive for headaches.    Physical Exam Vital Signs  I have reviewed the triage vital signs BP 115/73 (BP Location: Right Arm)   Pulse 92   Temp 98.8 F (37.1 C)   Resp 20   Ht 5\' 8"  (1.727 m)   Wt 92.5 kg   SpO2 100%   BMI 31.02 kg/m   Physical Exam Vitals and nursing note reviewed.  Constitutional:      General: She is not in acute distress.    Appearance: She is well-developed.  HENT:     Head: Normocephalic and atraumatic.     Comments: Small hordeolum on the right upper eyelid Eyes:     Conjunctiva/sclera: Conjunctivae normal.  Cardiovascular:     Rate and Rhythm: Normal rate and regular rhythm.     Heart sounds: No murmur heard. Pulmonary:     Effort: Pulmonary effort is normal. No respiratory distress.  Musculoskeletal:        General: No swelling.     Cervical back: Neck supple.  Skin:    General: Skin is warm and dry.     Capillary Refill: Capillary refill takes less than 2 seconds.  Neurological:     Mental Status: She is alert.     Cranial Nerves: No cranial nerve deficit.     Motor: No weakness.  Psychiatric:        Mood and Affect: Mood normal.     ED  Results and Treatments Labs (all labs ordered are listed, but only abnormal results are displayed) Labs Reviewed  RESP PANEL BY RT-PCR (RSV, FLU A&B, COVID)  RVPGX2  PREGNANCY, URINE                                                                                                                          Radiology DG Chest Portable 1 View  Result Date: 09/29/2022 CLINICAL DATA:  Shortness of breath EXAM: PORTABLE CHEST 1 VIEW COMPARISON:  05/21/2020 FINDINGS: The heart size and mediastinal contours are within normal limits. Both lungs are clear. The visualized skeletal structures are unremarkable. IMPRESSION: Negative chest. Electronically Signed   By: Tiburcio Pea M.D.   On: 09/29/2022 04:45    Pertinent labs & imaging results that were available during my care of the patient were reviewed by me and considered in my medical decision making (see MDM for details).  Medications Ordered in ED Medications  naproxen (NAPROSYN) tablet 500 mg (500 mg Oral Given 09/29/22 0807)  erythromycin ophthalmic ointment 1 Application (1 Application Right Eye Given 09/29/22 0946)  Procedures Procedures  (including critical care time)  Medical Decision Making / ED Course   This patient presents to the ED for concern of eye pain, headache, this involves an extensive number of treatment options, and is a complaint that carries with it a high risk of complications and morbidity.  The differential diagnosis includes tension headache, migraine headache, cluster headache, temporal arteritis, unspecified viral URI, COVID-19, influenza, hordeolum, stye  MDM: Patient seen emergency room for evaluation of eye pain and headache.  Physical exam with a small stye to the upper right eyelid but is otherwise unremarkable.  Neurologic exam with no focal motor or sensory deficits.  Pulmonary  exam unremarkable.  Pregnancy negative.  COVID, flu, RSV negative and obtained in the setting of headache and reported viral symptoms including shortness of breath, dizziness and joint pain.  Chest x-ray unremarkable.  Given no ER complaints of shortness of breath, additional laboratory workup not performed.  Patient received single dose Naprosyn and on reevaluation her symptoms have improved significantly.  Patient given erythromycin ointment for the developing lesion in the upper eyelid and was discharged with outpatient follow-up.  At this time she does not meet inpatient criteria and is safe for outpatient follow-up.   Additional history obtained:  -External records from outside source obtained and reviewed including: Chart review including previous notes, labs, imaging, consultation notes   Lab Tests: -I ordered, reviewed, and interpreted labs.   The pertinent results include:   Labs Reviewed  RESP PANEL BY RT-PCR (RSV, FLU A&B, COVID)  [id number]  PREGNANCY, URINE     Imaging Studies ordered: I ordered imaging studies including chest x-ray I independently visualized and interpreted imaging. I agree with the radiologist interpretation   Medicines ordered and prescription drug management: Meds ordered this encounter  Medications   naproxen (NAPROSYN) tablet 500 mg   erythromycin ophthalmic ointment 1 Application   DISCONTD: naproxen (NAPROSYN) 375 MG tablet    Sig: Take 1 tablet (375 mg total) by mouth 2 (two) times daily.    Dispense:  20 tablet    Refill:  0   naproxen (NAPROSYN) 375 MG tablet    Sig: Take 1 tablet (375 mg total) by mouth 2 (two) times daily.    Dispense:  20 tablet    Refill:  0    -I have reviewed the patients home medicines and have made adjustments as needed  Critical interventions none    Cardiac Monitoring: The patient was maintained on a cardiac monitor.  I personally viewed and interpreted the cardiac monitored which showed an underlying rhythm  of: NSR  Social Determinants of Health:  Factors impacting patients care include: none   Reevaluation: After the interventions noted above, I reevaluated the patient and found that they have :improved  Co morbidities that complicate the patient evaluation No past medical history on file.    Dispostion: I considered admission for this patient, but at this time patient does not meet inpatient criteria for admission she is safe for discharge with outpatient follow-up     Final Clinical Impression(s) / ED Diagnoses Final diagnoses:  Hordeolum externum of right upper eyelid  Acute nonintractable headache, unspecified headache type     @PCDICTATION @    Glendora Score, MD 09/29/22 [phone]

## 2022-09-29 NOTE — ED Provider Triage Note (Signed)
Emergency Medicine Provider Triage Evaluation Note  Nichole Garrett , a 22 y.o. female  was evaluated in triage.  Pt complains of tremulousness, never formally diagnosed with anxiety and a white spot under right eyelid  Review of Systems  Positive: Spot under eyelid and tremulousness Negative: Vomiting   Physical Exam  BP (!) 139/90 (BP Location: Left Arm)   Pulse 89   Temp 98.5 F (36.9 C) (Oral)   Resp 20   Ht 5\' 8"  (1.727 m)   Wt 92.5 kg   SpO2 100%   BMI 31.02 kg/m  Gen:   Awake, no distress   Resp:  Normal effort  MSK:   Moves extremities without difficulty  Other:  Tremulous and hyperventilating   Medical Decision Making  Medically screening exam initiated at 3:57 AM.  Appropriate orders placed.  Nichole Garrett was informed that the remainder of the evaluation will be completed by another provider, this initial triage assessment does not replace that evaluation, and the importance of remaining in the ED until their evaluation is complete.     Rahman Ferrall, MD 09/29/22 [phone]

## 2022-09-29 NOTE — ED Triage Notes (Signed)
Patient presents to ed via [facility] c/o sob and dizziness x 2 days with hand tremors. States she went to bed at 0845 and woke up at 0100am with pain in her right eye and headache . Also c/o bilateral knee pain .

## 2022-10-14 ENCOUNTER — Other Ambulatory Visit: Payer: Self-pay

## 2022-10-14 ENCOUNTER — Emergency Department (HOSPITAL_COMMUNITY): Payer: Medicaid Other

## 2022-10-14 ENCOUNTER — Emergency Department (HOSPITAL_COMMUNITY)
Admission: EM | Admit: 2022-10-14 | Discharge: 2022-10-14 | Disposition: A | Discharge status: Home / Self Care | Payer: Medicaid Other | Attending: Emergency Medicine | Admitting: Emergency Medicine

## 2022-10-14 ENCOUNTER — Encounter (HOSPITAL_COMMUNITY): Payer: Self-pay

## 2022-10-14 DIAGNOSIS — G8929 Other chronic pain: Secondary | ICD-10-CM | POA: Diagnosis not present

## 2022-10-14 DIAGNOSIS — M25561 Pain in right knee: Secondary | ICD-10-CM | POA: Diagnosis not present

## 2022-10-14 MED ORDER — LIDOCAINE 5 % EX PTCH
1.0000 | MEDICATED_PATCH | CUTANEOUS | Status: DC
  Administered 2022-10-14: 1 via TRANSDERMAL
  Filled 2022-10-14: qty 1

## 2022-10-14 MED ORDER — KETOROLAC TROMETHAMINE 60 MG/2ML IM SOLN
30.0000 mg | Freq: Once | INTRAMUSCULAR | Status: AC
  Administered 2022-10-14: 30 mg via INTRAMUSCULAR
  Filled 2022-10-14: qty 2

## 2022-10-14 MED ORDER — TRIAMCINOLONE ACETONIDE 0.1 % EX CREA: 1.0000 | TOPICAL_CREAM | Freq: Two times a day (BID) | CUTANEOUS | 0 refills | Status: DC

## 2022-10-14 MED ORDER — LIDOCAINE 5 % EX PTCH: 1.0000 | MEDICATED_PATCH | CUTANEOUS | 0 refills | Status: DC

## 2022-10-14 MED ORDER — METHOCARBAMOL 500 MG PO TABS: 500.0000 mg | ORAL_TABLET | Freq: Three times a day (TID) | ORAL | 0 refills | Status: DC | PRN

## 2022-10-14 MED ORDER — METHOCARBAMOL 500 MG PO TABS
500.0000 mg | ORAL_TABLET | Freq: Once | ORAL | Status: AC
Start: 2022-10-14 — End: 2022-10-14
  Administered 2022-10-14: 500 mg via ORAL
  Filled 2022-10-14: qty 1

## 2022-10-14 NOTE — ED Triage Notes (Signed)
Pt arrives with c/o right knee pain that has been going on for months. Per pt, she was told she has tendonitis and it has never healed.

## 2022-10-14 NOTE — ED Provider Notes (Addendum)
EMERGENCY DEPARTMENT AT [facility] Provider Note   CSN: [id number] Arrival date & time: 10/14/22  2019     History  Chief Complaint  Patient presents with   Knee Pain    Nichole Garrett is a 22 y.o. female.   Knee Pain Patient presents for right knee pain.  Onset was approximately 6 months ago.  She was seen at urgent care in November.  She was prescribed a prednisone taper, which she took without relief.  She has been taking ibuprofen and Tylenol, also without relief.  Pain is located in the anterior lower aspect of her right knee.  It is worse with knee flexion.  She has continued to walk on it.  She states that the pain never got better and has continued to get worse.  She denies any other areas of discomfort.  She denies any fevers or chills.     Home Medications Prior to Admission medications   Medication Sig Start Date End Date Taking? Authorizing Provider  benzonatate (TESSALON) 100 MG capsule Take 1 capsule (100 mg total) by mouth every 8 (eight) hours. 05/22/20   Linwood Dibbles, MD  HYDROcodone-acetaminophen (NORCO/VICODIN) 5-325 MG tablet Take 1 tablet by mouth every 6 (six) hours as needed for moderate pain or severe pain. 07/12/19   Mardella Layman, MD  methocarbamol (ROBAXIN) 500 MG tablet Take 1 tablet (500 mg total) by mouth 2 (two) times daily. 04/03/19   Khatri, Hina, PA-C  naproxen (NAPROSYN) 375 MG tablet Take 1 tablet (375 mg total) by mouth 2 (two) times daily. 05/22/20   Linwood Dibbles, MD  naproxen (NAPROSYN) 375 MG tablet Take 1 tablet (375 mg total) by mouth 2 (two) times daily. 09/29/22   Kommor, Madison, MD  ondansetron (ZOFRAN ODT) 8 MG disintegrating tablet Take 1 tablet (8 mg total) by mouth every 8 (eight) hours as needed for nausea or vomiting. 05/22/20   Linwood Dibbles, MD  penicillin v potassium (VEETID) 500 MG tablet Take 1 tablet (500 mg total) by mouth 3 (three) times daily. 07/12/19   Mardella Layman, MD  predniSONE (DELTASONE) 10 MG tablet  Take 3 tabs on days 1-3, 2 tabs on days 4-6, 1 tab on days 7-9 04/27/22   Lorre Munroe, NP      Allergies    Patient has no known allergies.    Review of Systems   Review of Systems  Musculoskeletal:  Positive for arthralgias.  Skin:  Positive for rash.  All other systems reviewed and are negative.   Physical Exam Updated Vital Signs BP (!) 144/75   Pulse 84   Temp 98 F (36.7 C) (Oral)   Resp 16   Ht 5\' 8"  (1.727 m)   Wt 92.5 kg   SpO2 100%   BMI 31.02 kg/m  Physical Exam Vitals and nursing note reviewed.  Constitutional:      General: She is not in acute distress.    Appearance: Normal appearance. She is well-developed. She is not ill-appearing, toxic-appearing or diaphoretic.  HENT:     Head: Normocephalic and atraumatic.     Right Ear: External ear normal.     Left Ear: External ear normal.     Nose: Nose normal.     Mouth/Throat:     Mouth: Mucous membranes are moist.  Eyes:     Extraocular Movements: Extraocular movements intact.     Conjunctiva/sclera: Conjunctivae normal.  Cardiovascular:     Rate and Rhythm: Normal rate and regular rhythm.  Pulmonary:     Effort: Pulmonary effort is normal. No respiratory distress.  Abdominal:     General: There is no distension.     Palpations: Abdomen is soft.  Musculoskeletal:        General: Tenderness present. No swelling or deformity.     Cervical back: Normal range of motion and neck supple.     Right lower leg: No edema.     Left lower leg: No edema.     Comments: Flexion and extension of right knee is intact, however, flexion is severely limited by pain.  Skin:    General: Skin is warm and dry.     Coloration: Skin is not jaundiced or pale.     Comments: Pruritic vesicular lesions on bilateral hands and distal arms, consistent with contact dermatitis.  Neurological:     General: No focal deficit present.     Mental Status: She is alert and oriented to person, place, and time.     Cranial Nerves: No  cranial nerve deficit.     Sensory: No sensory deficit.     Motor: No weakness.     Coordination: Coordination normal.  Psychiatric:        Mood and Affect: Mood normal. Affect is flat.        Behavior: Behavior normal.        Thought Content: Thought content normal.        Judgment: Judgment normal.     ED Results / Procedures / Treatments   Labs (all labs ordered are listed, but only abnormal results are displayed) Labs Reviewed - No data to display  EKG None  Radiology DG Knee Complete 4 Views Right  Result Date: 10/14/2022 CLINICAL DATA:  Knee pain.  No injury. EXAM: RIGHT KNEE - COMPLETE 4+ VIEW COMPARISON:  None Available. FINDINGS: No evidence of fracture, dislocation, or joint effusion. No evidence of arthropathy or other focal bone abnormality. Soft tissues are unremarkable. IMPRESSION: Negative. Electronically Signed   By: Charlett Nose M.D.   On: 10/14/2022 21:24    Procedures Procedures    Medications Ordered in ED Medications  lidocaine (LIDODERM) 5 % 1 patch (1 patch Transdermal Patch Applied 10/14/22 2229)  ketorolac (TORADOL) injection 30 mg (30 mg Intramuscular Given 10/14/22 2229)  methocarbamol (ROBAXIN) tablet 500 mg (500 mg Oral Given 10/14/22 2228)    ED Course/ Medical Decision Making/ A&P                             Medical Decision Making Amount and/or Complexity of Data Reviewed Radiology: ordered.  Risk Prescription drug management.   Patient presents for chronic right knee pain.  Right knee has been hurting her over the past 6 months.  Pain has been isolated to the lower anterior aspect of her right knee.  On exam, there is no swelling or erythema.  There is no warmth.  She has tenderness along the patellar tendon.  She is able to flex and extend knee, however, flexion is severely limited by pain.  X-ray imaging does not show any osseous or joint abnormalities.  Patient has tried prednisone, Tylenol, and ibuprofen with no relief.  Because the  medications do not work, she has stopped taking them.  Patient was given multimodal pain control here in the ED.  She was provided with knee brace, crutches and advised to follow-up with orthopedic surgery.  While in the ED, patient also endorsed recent pruritic rash  to bilateral hands and wrist areas.  She denies any known contact with allergens.  She does have a history of eczema.  Appearance of rash is vesicular.  She was prescribed steroid cream.  For her knee, she was advised to continue ibuprofen, Tylenol, and muscle relaxer as needed.  She was discharged in stable condition.        Final Clinical Impression(s) / ED Diagnoses Final diagnoses:  Chronic pain of right knee    Rx / DC Orders ED Discharge Orders     None         Gloris Manchester, MD 10/14/22 2237    Gloris Manchester, MD 10/14/22 9811    Gloris Manchester, MD 10/14/22 2252

## 2022-10-14 NOTE — Discharge Instructions (Addendum)
Continue ibuprofen and Tylenol.  Prescriptions for lidocaine patches and muscle relaxer was sent to your pharmacy.  An additional prescription for a steroid cream was prescribed.  Apply to areas of rash twice a day.  Call the telephone number below to set up a follow-up appointment with the orthopedic surgeon.  Return to the emergency department for any new or worsening symptoms of concern.

## 2022-10-16 ENCOUNTER — Ambulatory Visit: Payer: Medicaid Other | Admitting: Family Medicine

## 2022-10-20 ENCOUNTER — Encounter (HOSPITAL_COMMUNITY): Payer: Self-pay

## 2022-10-20 ENCOUNTER — Other Ambulatory Visit: Payer: Self-pay

## 2022-10-20 ENCOUNTER — Emergency Department (HOSPITAL_COMMUNITY)
Admission: EM | Admit: 2022-10-20 | Discharge: 2022-10-20 | Disposition: A | Discharge status: Home / Self Care | Payer: Medicaid Other | Source: Home / Self Care

## 2022-10-20 ENCOUNTER — Emergency Department (HOSPITAL_COMMUNITY)
Admission: EM | Admit: 2022-10-20 | Discharge: 2022-10-20 | Disposition: A | Discharge status: Home / Self Care | Payer: Medicaid Other | Attending: Emergency Medicine | Admitting: Emergency Medicine

## 2022-10-20 DIAGNOSIS — J3489 Other specified disorders of nose and nasal sinuses: Secondary | ICD-10-CM

## 2022-10-20 DIAGNOSIS — U071 COVID-19: Secondary | ICD-10-CM | POA: Insufficient documentation

## 2022-10-20 DIAGNOSIS — R0989 Other specified symptoms and signs involving the circulatory and respiratory systems: Secondary | ICD-10-CM | POA: Diagnosis present

## 2022-10-20 LAB — RESP PANEL BY RT-PCR (RSV, FLU A&B, COVID)  RVPGX2
Influenza A by PCR: NEGATIVE
Influenza B by PCR: NEGATIVE
Resp Syncytial Virus by PCR: NEGATIVE
SARS Coronavirus 2 by RT PCR: POSITIVE — AB

## 2022-10-20 LAB — POC URINE PREG, ED
Preg Test, Ur: NEGATIVE
Preg Test, Ur: NEGATIVE

## 2022-10-20 MED ORDER — FLUTICASONE PROPIONATE 50 MCG/ACT NA SUSP: 1.0000 | Freq: Every day | NASAL | 2 refills | Status: DC

## 2022-10-20 MED ORDER — KETOROLAC TROMETHAMINE 15 MG/ML IJ SOLN
15.0000 mg | Freq: Once | INTRAMUSCULAR | Status: AC
  Administered 2022-10-20: 15 mg via INTRAMUSCULAR
  Filled 2022-10-20: qty 1

## 2022-10-20 MED ORDER — LORATADINE 10 MG PO TABS: 10.0000 mg | ORAL_TABLET | Freq: Every day | ORAL | 0 refills | Status: DC

## 2022-10-20 NOTE — Discharge Instructions (Addendum)
You were seen in the ER today for evaluation of your runny nose and nasal congestion.  As previously discussed, this does not meet criteria for antibiotic at this time.  If you continue to have symptoms for greater than 10 days after trying over-the-counter medication, he can return to your primary care, urgent care, or the ER for antibiotics at this time.  I recommend over-the-counter Tylenol cold and sinus or Alka-Seltzer cold and sinus for symptoms.  I am also prescribing you some medication called Flonase which should help you with your frontal headache and your nasal congestion.  Additionally, minimum prescribing some Claritin which will take daily for the next month help you through the allergy season.  If further reason you have trouble with your insurance following these medications, they can be picked up over-the-counter.  If you have any new or worsening symptoms, or any concerns, please return to the nearest emergency room for evaluation.  Contact a health care provider if: You have a fever. You have a sore throat or difficulty swallowing. You have a headache. You have sinus or ear pain. You have a cough that does not go away. The mucus from your nose becomes thick and is green or yellow in color. You have cold or flu symptoms that last more than 10 days.

## 2022-10-20 NOTE — ED Triage Notes (Signed)
Patient complains of runny nose x 12 hours. Patient thinks she has cold with very mild congestion

## 2022-10-20 NOTE — Discharge Instructions (Addendum)
Please follow-up with your primary care provider regarding recent symptoms and ER visit.  Please take Tylenol or ibuprofen every 6 hours as needed for pain.  You may use Mucinex over-the-counter.  Please remain hydrated and eat food as tolerated.  If symptoms worsen please return to ER.

## 2022-10-20 NOTE — ED Triage Notes (Signed)
Covid+, found out today. Pt has had sx for a few days. C/o headache, congestion, overall unwell feeling.

## 2022-10-20 NOTE — ED Provider Notes (Signed)
[place] EMERGENCY DEPARTMENT AT [facility] Provider Note   CSN: [id number] Arrival date & time: 10/20/22  7829     History No chief complaint on file.   Nichole Garrett is a 22 y.o. female reportedly otherwise healthy presents emerged from today for evaluation of nasal congestion, rhinorrhea, and frontal headache since yesterday morning.  She reports that her boyfriend recently had COVID however she does not think that she has COVID.  She took some ibuprofen yesterday around 2100.  She denies any blurry vision, ear pain, sore throat, neck stiffness, fever, chills, nausea, vomiting, abdominal pain, chest pain, shortness breather, or cough.  She is requesting antibiotics and toradol shot for her headache.  No known drug allergies.  Denies any medical, or surgical history.  Denies any tobacco, EtOH illicit drug use.  HPI     Home Medications Prior to Admission medications   Medication Sig Start Date End Date Taking? Authorizing Provider  fluticasone (FLONASE) 50 MCG/ACT nasal spray Place 1 spray into both nostrils daily. 10/20/22  Yes Achille Rich, PA-C  loratadine (CLARITIN) 10 MG tablet Take 1 tablet (10 mg total) by mouth daily. 10/20/22  Yes Achille Rich, PA-C  benzonatate (TESSALON) 100 MG capsule Take 1 capsule (100 mg total) by mouth every 8 (eight) hours. 05/22/20   Linwood Dibbles, MD  HYDROcodone-acetaminophen (NORCO/VICODIN) 5-325 MG tablet Take 1 tablet by mouth every 6 (six) hours as needed for moderate pain or severe pain. 07/12/19   Mardella Layman, MD  lidocaine (LIDODERM) 5 % Place 1 patch onto the skin daily. Remove & Discard patch within 12 hours or as directed by MD 10/14/22   Gloris Manchester, MD  methocarbamol (ROBAXIN) 500 MG tablet Take 1 tablet (500 mg total) by mouth every 8 (eight) hours as needed for muscle spasms. 10/14/22   Gloris Manchester, MD  naproxen (NAPROSYN) 375 MG tablet Take 1 tablet (375 mg total) by mouth 2 (two) times daily. 05/22/20   Linwood Dibbles, MD   naproxen (NAPROSYN) 375 MG tablet Take 1 tablet (375 mg total) by mouth 2 (two) times daily. 09/29/22   Kommor, Madison, MD  ondansetron (ZOFRAN ODT) 8 MG disintegrating tablet Take 1 tablet (8 mg total) by mouth every 8 (eight) hours as needed for nausea or vomiting. 05/22/20   Linwood Dibbles, MD  penicillin v potassium (VEETID) 500 MG tablet Take 1 tablet (500 mg total) by mouth 3 (three) times daily. 07/12/19   Mardella Layman, MD  predniSONE (DELTASONE) 10 MG tablet Take 3 tabs on days 1-3, 2 tabs on days 4-6, 1 tab on days 7-9 04/27/22   Lorre Munroe, NP  triamcinolone cream (KENALOG) 0.1 % Apply 1 Application topically 2 (two) times daily. 10/14/22   Gloris Manchester, MD      Allergies    Patient has no known allergies.    Review of Systems   Review of Systems  Constitutional:  Negative for chills and fever.  HENT:  Positive for congestion and rhinorrhea. Negative for ear pain and sore throat.   Eyes:  Negative for photophobia and visual disturbance.  Respiratory:  Negative for cough and shortness of breath.   Cardiovascular:  Negative for chest pain.  Gastrointestinal:  Negative for abdominal pain, constipation, diarrhea, nausea and vomiting.  Neurological:  Positive for headaches.    Physical Exam Updated Vital Signs BP 139/82   Pulse 89   Temp 98.3 F (36.8 C) (Oral)   Resp 18   SpO2 99%  Physical Exam  Vitals and nursing note reviewed.  Constitutional:      General: She is not in acute distress.    Appearance: She is not ill-appearing or toxic-appearing.  HENT:     Head: Normocephalic and atraumatic.     Right Ear: Tympanic membrane, ear canal and external ear normal.     Left Ear: Tympanic membrane, ear canal and external ear normal.     Nose:     Comments: Bilateral nasal turbinate edema and erythema with scant clear nasal discharge.    Mouth/Throat:     Mouth: Mucous membranes are moist.     Pharynx: No oropharyngeal exudate or posterior oropharyngeal erythema.      Comments: No pharyngeal erythema, exudate, or edema noted.  Uvula midline.  Airway patent.  Moist mucous membranes. Eyes:     General: No scleral icterus.    Extraocular Movements: Extraocular movements intact.     Conjunctiva/sclera: Conjunctivae normal.     Pupils: Pupils are equal, round, and reactive to light.  Cardiovascular:     Rate and Rhythm: Normal rate.  Pulmonary:     Effort: Pulmonary effort is normal. No respiratory distress.     Breath sounds: Normal breath sounds.  Abdominal:     Palpations: Abdomen is soft.     Tenderness: There is no abdominal tenderness. There is no guarding or rebound.  Musculoskeletal:        General: No deformity.     Cervical back: Normal range of motion.  Skin:    Findings: No rash.  Neurological:     General: No focal deficit present.     Mental Status: She is alert.     Cranial Nerves: No cranial nerve deficit.     Sensory: No sensory deficit.     Motor: No weakness.     ED Results / Procedures / Treatments   Labs (all labs ordered are listed, but only abnormal results are displayed) Labs Reviewed  RESP PANEL BY RT-PCR (RSV, FLU A&B, COVID)  [id number]  POC URINE PREG, ED    EKG None  Radiology No results found.  Procedures Procedures   Medications Ordered in ED Medications  ketorolac (TORADOL) 15 MG/ML injection 15 mg (has no administration in time range)    ED Course/ Medical Decision Making/ A&P                           Medical Decision Making Risk OTC drugs. Prescription drug management.   22 y.o. female presents to the ER for evaluation of nasal ingestion, rhinorrhea, frontal headache. Differential diagnosis includes but is not limited to seasonal allergies, COVID, flu, RSV, sinusitis,meningitis. Vital signs unremarkable. Physical exam as noted above.   I independently reviewed and interpreted the patient's labs. POC urine negative. COVID, RSV, and flu pending.   Physical exam pertinent for some nasal  congestion and rhinorrhea.  Posterior oropharynx exam is unremarkable.  Patient in no acute distress.  Cranial nerve exam normal.  Lungs are clear to auscultation.  Abdomen soft and nontender.  No focal deficit.  Tenderness to percussion over the frontal sinus bilaterally.  Due to MIPS criteria, she does not meet any criteria for antibiotic treatment for sinusitis.  She is nearly requesting sinusitis and Toradol.  I had at length discussion with her about home antibiotics are not first-line and to try OTC cold medication, in addition to the Claritin and Flonase I will send her home with.  She denies worsening of  her life.  Relieved with ibuprofen last night.  She has no focal neurodeficits or any nuchal rigidity, doubt any meningitis. If she were to have continued or worsening symptoms, I encouraged her to return. Will order POC pregnant test for toradol.   Toradol ordered given negative pregnancy test. Patient is aware to follow up with her COVID results on Mychart.   We discussed the results of the labs/imaging. The plan is take OTC medications and follow up as needed. Follow up with MyChart for viral panel results. We discussed strict return precautions and red flag symptoms. The patient verbalized their understanding and agrees to the plan. The patient is stable and being discharged home in good condition.  Portions of this report may have been transcribed using voice recognition software. Every effort was made to ensure accuracy; however, inadvertent computerized transcription errors may be present.   Final Clinical Impression(s) / ED Diagnoses Final diagnoses:  Rhinorrhea    Rx / DC Orders ED Discharge Orders          Ordered    fluticasone (FLONASE) 50 MCG/ACT nasal spray  Daily        10/20/22 1006    loratadine (CLARITIN) 10 MG tablet  Daily        10/20/22 1006              Achille Rich, [place] 10/20/22 1048    Alvira Monday, MD 10/21/22 2200

## 2022-10-20 NOTE — ED Provider Notes (Signed)
[place] EMERGENCY DEPARTMENT AT [facility] Provider Note   CSN: [id number] Arrival date & time: 10/20/22  2110     History  Chief Complaint  Patient presents with   Headache    Nichole Garrett is a 22 y.o. female presented for testing positive for COVID earlier today.  Patient was seen today at [facility] earlier and tested positive for COVID and so she wanted to be reevaluated.  Patient states that she is currently asymptomatic.  Patient that she is able to tolerate food and fluids orally.  Patient has not tried any medications up to this point.  Patient denies chest pain, shortness of breath, nausea/vomiting, headache, vision changes, productive cough, neck stiffness  Patient denied headaches with me but did state in triage she had a headache  Home Medications Prior to Admission medications   Medication Sig Start Date End Date Taking? Authorizing Provider  benzonatate (TESSALON) 100 MG capsule Take 1 capsule (100 mg total) by mouth every 8 (eight) hours. 05/22/20   Linwood Dibbles, MD  fluticasone (FLONASE) 50 MCG/ACT nasal spray Place 1 spray into both nostrils daily. 10/20/22   Achille Rich, PA-C  HYDROcodone-acetaminophen (NORCO/VICODIN) 5-325 MG tablet Take 1 tablet by mouth every 6 (six) hours as needed for moderate pain or severe pain. 07/12/19   Mardella Layman, MD  lidocaine (LIDODERM) 5 % Place 1 patch onto the skin daily. Remove & Discard patch within 12 hours or as directed by MD 10/14/22   Gloris Manchester, MD  loratadine (CLARITIN) 10 MG tablet Take 1 tablet (10 mg total) by mouth daily. 10/20/22   Achille Rich, PA-C  methocarbamol (ROBAXIN) 500 MG tablet Take 1 tablet (500 mg total) by mouth every 8 (eight) hours as needed for muscle spasms. 10/14/22   Gloris Manchester, MD  naproxen (NAPROSYN) 375 MG tablet Take 1 tablet (375 mg total) by mouth 2 (two) times daily. 05/22/20   Linwood Dibbles, MD  naproxen (NAPROSYN) 375 MG tablet Take 1 tablet (375 mg total) by mouth 2 (two)  times daily. 09/29/22   Kommor, [place], MD  ondansetron (ZOFRAN ODT) 8 MG disintegrating tablet Take 1 tablet (8 mg total) by mouth every 8 (eight) hours as needed for nausea or vomiting. 05/22/20   Linwood Dibbles, MD  penicillin v potassium (VEETID) 500 MG tablet Take 1 tablet (500 mg total) by mouth 3 (three) times daily. 07/12/19   Mardella Layman, MD  predniSONE (DELTASONE) 10 MG tablet Take 3 tabs on days 1-3, 2 tabs on days 4-6, 1 tab on days 7-9 04/27/22   Lorre Munroe, NP  triamcinolone cream (KENALOG) 0.1 % Apply 1 Application topically 2 (two) times daily. 10/14/22   Gloris Manchester, MD      Allergies    Patient has no known allergies.    Review of Systems   Review of Systems See HPI Physical Exam Updated Vital Signs BP 122/73   Pulse 99   Temp 98.8 F (37.1 C) (Oral)   Resp 18   Ht 5\' 8"  (1.727 m)   Wt 92.5 kg   SpO2 99%   BMI 31.02 kg/m  Physical Exam Vitals reviewed.  Constitutional:      General: She is not in acute distress. HENT:     Head: Normocephalic and atraumatic.     Mouth/Throat:     Mouth: Mucous membranes are moist.     Pharynx: No posterior oropharyngeal erythema.     Comments: No PTA noted No abnormalities noted No swelling  noted Eyes:     Extraocular Movements: Extraocular movements intact.     Conjunctiva/sclera: Conjunctivae normal.     Pupils: Pupils are equal, round, and reactive to light.  Neck:     Comments: No neck swelling Full active range of motion in neck Cardiovascular:     Rate and Rhythm: Normal rate and regular rhythm.     Pulses: Normal pulses.     Heart sounds: Normal heart sounds.     Comments: 2+ bilateral radial/dorsalis pedis pulses with regular rate Pulmonary:     Effort: Pulmonary effort is normal. No respiratory distress.     Breath sounds: Normal breath sounds.  Abdominal:     Palpations: Abdomen is soft.     Tenderness: There is no abdominal tenderness. There is no guarding or rebound.  Musculoskeletal:         General: Normal range of motion.     Cervical back: Normal range of motion and neck supple.     Comments: 5 out of 5 bilateral grip/leg extension strength  Skin:    General: Skin is warm and dry.     Capillary Refill: Capillary refill takes less than 2 seconds.  Neurological:     General: No focal deficit present.     Mental Status: She is alert and oriented to person, place, and time.     Comments: Sensation intact in all 4 limbs  Psychiatric:        Mood and Affect: Mood normal.     ED Results / Procedures / Treatments   Labs (all labs ordered are listed, but only abnormal results are displayed) Labs Reviewed - No data to display  EKG None  Radiology No results found.  Procedures Procedures    Medications Ordered in ED Medications - No data to display  ED Course/ Medical Decision Making/ A&P                             Medical Decision Making  Nichole Garrett 22 y.o. presented today after testing positive for COVID earlier today. Working DDx that I considered at this time includes, but not limited to, viral illness, peritonsillar abscess, retropharyngeal abscess, pneumonia, meningitis.  R/o DDx: PTA, RPA, pneumonia, meningitis: These diagnoses are inconsistent with patient's history, presentation, lab/imaging findings  Review of prior external notes: 10/20/2022 ED provider  Unique Tests and My Interpretation:  None  Discussion with Independent Historian: None  Discussion of Management of Tests: None  Risk: Low: based on diagnostic testing/clinical impression and treatment plan  Risk Stratification Score: None  Plan: Patient presented after testing positive for COVID earlier this morning at [facility].  Patient had stable vitals and was in no acute distress.  Patient unremarkable physical exam and denied any symptoms but stated that she wanted to be evaluated.  Patient is tolerating food and fluids orally and I spoke to the patient about how the  symptoms may last up to 2 weeks but that is very important to remain hydrated.  Patient may use Mucinex as she feels congested in her chest but lungs were clear to auscultation.  I spoke to the patient about how she may use Tylenol or ibuprofen every 6 hours as needed for pain and that she will need to follow-up with her primary care provider.  I offered Tessalon however patient denied stating that she is not having any coughing.  Overall patient had unremarkable physical exam and has COVID and  will treat symptomatically.  Patient was given return precautions.patient stable for discharge at this time.  Patient verbalized understanding of plan.         Final Clinical Impression(s) / ED Diagnoses Final diagnoses:  COVID    Rx / DC Orders ED Discharge Orders     None         Remi Deter 10/20/22 2322    Bethann Berkshire, MD 10/21/22 1720

## 2022-11-28 ENCOUNTER — Ambulatory Visit (INDEPENDENT_AMBULATORY_CARE_PROVIDER_SITE_OTHER): Payer: Medicaid Other | Admitting: Family

## 2022-11-28 ENCOUNTER — Encounter: Payer: Self-pay | Admitting: Family

## 2022-11-28 VITALS — BP 106/75 | HR 87 | Temp 98.7°F | Ht 68.0 in | Wt 203.0 lb

## 2022-11-28 DIAGNOSIS — Z7689 Persons encountering health services in other specified circumstances: Secondary | ICD-10-CM | POA: Diagnosis not present

## 2022-11-28 DIAGNOSIS — M79642 Pain in left hand: Secondary | ICD-10-CM

## 2022-11-28 DIAGNOSIS — G8929 Other chronic pain: Secondary | ICD-10-CM | POA: Diagnosis not present

## 2022-11-28 DIAGNOSIS — R251 Tremor, unspecified: Secondary | ICD-10-CM | POA: Diagnosis not present

## 2022-11-28 DIAGNOSIS — K219 Gastro-esophageal reflux disease without esophagitis: Secondary | ICD-10-CM | POA: Diagnosis not present

## 2022-11-28 DIAGNOSIS — M79641 Pain in right hand: Secondary | ICD-10-CM | POA: Diagnosis not present

## 2022-11-28 DIAGNOSIS — M25561 Pain in right knee: Secondary | ICD-10-CM | POA: Diagnosis not present

## 2022-11-28 DIAGNOSIS — L309 Dermatitis, unspecified: Secondary | ICD-10-CM | POA: Diagnosis not present

## 2022-11-28 MED ORDER — OMEPRAZOLE 20 MG PO CPDR
20.0000 mg | DELAYED_RELEASE_CAPSULE | Freq: Every day | ORAL | 0 refills | Status: AC
Start: 2022-11-28 — End: 2023-02-26

## 2022-11-28 NOTE — Progress Notes (Signed)
Subjective:    Nichole Garrett - 22 y.o. female MRN [id number]  Date of birth: 11-24-2000  HPI  Nichole Garrett is to establish care.   Current issues and/or concerns: - Acid reflux caused by certain spicy foods and certain beverages. She tried over-the-counter medications with no relief. She denies red flag symptoms associated with acid reflux. - Reports feeling improved and back to baseline since Urgent Care visit on 10/20/2022 for Covid positive. - States thinks low blood pressure on one occurrence was related to construction going on behind her home. 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 States "the ground was shaking" due to Holiday representative. States she ate something salty and felt improved. She does not check blood pressure consistently at home.  - Chronic right knee pain. She denies recent trauma/injury and red flag symptoms. Reports she was seen at Urgent Care and the Emergency Department and was told everything was stable/ right knee xray was normal. She has tried over-the-counter and prescribed medications with no relief. Reports she was referred to Orthopedics in the past and was unable to schedule an appointment. She would like referral back to the same. - Reports chronic bilateral hand pain and thinks related to arthritis. She denies recent trauma/injury and red flag symptoms. - Eczema of bilateral arms (flexural), right hand, and right foot. She has tried several over-the-counter and prescribed medications with no relief. She would like referral to Dermatology. - States "hands shaking" and "legs shaking". Reports hands shaking has always been. Reports recently legs began shaking. She denies associated red flag symptoms. She is ready for referral to a specialist for evaluation. - No further issues/concerns for discussion today.   ROS per HPI    Health Maintenance:  Health Maintenance Due  Topic Date  Due   HPV VACCINES (1 - Risk 3-dose series) Never done   HIV Screening  Never done   Hepatitis C Screening  Never done   DTaP/Tdap/Td (1 - Tdap) Never done   PAP-Cervical Cytology Screening  Never done   PAP SMEAR-Modifier  Never done     Past Medical History: Patient Active Problem List   Diagnosis Date Noted   SNORING, HX OF 03/09/2007    Social History   reports that she has never smoked. She has never used smokeless tobacco. She reports that she does not drink alcohol and does not use drugs.   Family History  family history includes Healthy in her father and mother.   Medications: reviewed and updated   Objective:   Physical Exam BP 106/75   Pulse 87   Temp 98.7 F (37.1 C) (Oral)   Ht 5\' 8"  (1.727 m)   Wt 203 lb (92.1 kg)   LMP 11/24/2022   SpO2 95%   BMI 30.87 kg/m   Physical Exam HENT:     Head: Normocephalic and atraumatic.     Nose: Nose normal.     Mouth/Throat:     Mouth: Mucous membranes are moist.     Pharynx: Oropharynx is clear.  Eyes:     Extraocular Movements: Extraocular movements intact.     Conjunctiva/sclera: Conjunctivae normal.     Pupils: Pupils are equal, round, and reactive to light.  Cardiovascular:     Rate and Rhythm: Normal rate and regular rhythm.     Pulses: Normal pulses.     Heart sounds: Normal heart sounds.  Pulmonary:     Effort: Pulmonary effort  is normal.     Breath sounds: Normal breath sounds.  Musculoskeletal:     Right shoulder: Normal.     Left shoulder: Normal.     Right upper arm: Normal.     Left upper arm: Normal.     Right elbow: Normal.     Left elbow: Normal.     Right forearm: Normal.     Left forearm: Normal.     Right wrist: Normal.     Left wrist: Normal.     Right hand: Normal.     Left hand: Normal.     Cervical back: Normal, normal range of motion and neck supple.     Thoracic back: Normal.     Lumbar back: Normal.     Right hip: Normal.     Left hip: Normal.     Right upper leg: Normal.      Left upper leg: Normal.     Right knee: Normal.     Left knee: Normal.     Right lower leg: Normal.     Left lower leg: Normal.     Right ankle: Normal.     Left ankle: Normal.     Right foot: Normal.     Left foot: Normal.  Skin:    General: Skin is warm and dry.     Comments: Eczema bilateral flexural arms, right hand, right foot. No drainage. No additional presentation.  Neurological:     General: No focal deficit present.     Mental Status: She is alert and oriented to person, place, and time.  Psychiatric:        Mood and Affect: Mood normal.        Behavior: Behavior normal.        Assessment & Plan:  1. Encounter to establish care - Patient presents today to establish care. During the interim follow-up with primary provider as scheduled.  - Return for annual physical examination, labs, and health maintenance. Arrive fasting meaning having no food for at least 8 hours prior to appointment. You may have only water or black coffee. Please take scheduled medications as normal.  2. Gastroesophageal reflux disease, unspecified whether esophagitis present - Omeprazole as prescribed. Counseled on medication adherence/adverse effects.  - Follow-up with primary provider in 4 weeks or sooner if needed.  - omeprazole (PRILOSEC) 20 MG capsule; Take 1 capsule (20 mg total) by mouth daily.  Dispense: 90 capsule; Refill: 0  3. Chronic pain of right knee 4. Pain in both hands - Patient declined pharmacological therapy.  - Referral to Orthopedics for further evaluation/management. During the interim follow-up with primary provider as scheduled until established with referral. - Ambulatory referral to Orthopedics  5. Eczema, unspecified type - Patient declined pharmacological therapy.  - Referral to Dermatology for further evaluation/management. During the interim follow-up with primary provider as scheduled until established with referral. - Ambulatory referral to Dermatology  6.  Tremor of both hands 7. Tremors of nervous system - Referral to Neurology for further evaluation/management. During the interim follow-up with primary provider as scheduled until established with referral. - Ambulatory referral to Neurology     Patient was given clear instructions to go to Emergency Department or return to medical center if symptoms don't improve, worsen, or new problems develop.The patient verbalized understanding.  I discussed the assessment and treatment plan with the patient. The patient was provided an opportunity to ask questions and all were answered. The patient agreed with the plan and demonstrated an  understanding of the instructions.   The patient was advised to call back or seek an in-person evaluation if the symptoms worsen or if the condition fails to improve as anticipated.    Ricky Stabs, NP 11/28/2022, 8:46 AM Primary Care at [facility]

## 2022-11-28 NOTE — Patient Instructions (Signed)
Thank you for choosing Primary Care at [facility] for your medical home!    Nichole Garrett was seen by Rema Fendt, NP today.   Nichole Garrett's primary care provider is Ricky Stabs, NP.   For the best care possible,  you should try to see Ricky Stabs, NP whenever you come to office.   We look forward to seeing you again soon!  If you have any questions about your visit today,  please call us at [phone]  Or feel free to reach your provider via MyChart.   Keeping you healthy   Get these tests Blood pressure- Have your blood pressure checked once a year by your healthcare provider.  Normal blood pressure is 120/80. Weight- Have your body mass index (BMI) calculated to screen for obesity.  BMI is a measure of body fat based on height and weight. You can also calculate your own BMI at https://www.west-esparza.com/. Cholesterol- Have your cholesterol checked regularly starting at age 18, sooner may be necessary if you have diabetes, high blood pressure, if a family member developed heart diseases at an early age or if you smoke.  Chlamydia, HIV, and other sexual transmitted disease- Get screened each year until the age of 48 then within three months of each new sexual partner. Diabetes- Have your blood sugar checked regularly if you have high blood pressure, high cholesterol, a family history of diabetes or if you are overweight.   Get these vaccines Flu shot- Every fall. Tetanus shot- Every 10 years. Menactra- Single dose; prevents meningitis.   Take these steps Don't smoke- If you do smoke, ask your healthcare provider about quitting. For tips on how to quit, go to www.smokefree.gov or call 1-800-QUIT-NOW. Be physically active- Exercise 5 days a week for at least 30 minutes.  If you are not already physically active start slow and gradually work up to 30 minutes of moderate physical activity.  Examples of moderate activity include walking briskly, mowing the yard,  dancing, swimming bicycling, etc. Eat a healthy diet- Eat a variety of healthy foods such as fruits, vegetables, low fat milk, low fat cheese, yogurt, lean meats, poultry, fish, beans, tofu, etc.  For more information on healthy eating, go to www.thenutritionsource.org Drink alcohol in moderation- Limit alcohol intake two drinks or less a day.  Never drink and drive. Dentist- Brush and floss teeth twice daily; visit your dentis twice a year. Depression-Your emotional health is as important as your physical health.  If you're feeling down, losing interest in things you normally enjoy please talk with your healthcare provider. Gun Safety- If you keep a gun in your home, keep it unloaded and with the safety lock on.  Bullets should be stored separately. Helmet use- Always wear a helmet when riding a motorcycle, bicycle, rollerblading or skateboarding. Safe sex- If you may be exposed to a sexually transmitted infection, use a condom Seat belts- Seat bels can save your life; always wear one. Smoke/Carbon Monoxide detectors- These detectors need to be installed on the appropriate level of your home.  Replace batteries at least once a year. Skin Cancer- When out in the sun, cover up and use sunscreen SPF 15 or higher. Violence- If anyone is threatening or hurting you, please tell your healthcare provider.

## 2022-11-28 NOTE — Progress Notes (Signed)
Pt wants to know if she can get an allergy test done, to see what she is allergic too.  Pt had bad acid reflex and is wondering if she can get prescribed anything for it.  Pt states she had covid a month ago.  Pt wants to talk about potentially having low blood pressures and at home being dizzy when she used her moms BP monitor.  Pt has right knee pain 5 on the pain scale.

## 2022-12-12 ENCOUNTER — Encounter: Payer: Medicaid Other | Admitting: Family

## 2022-12-12 NOTE — Progress Notes (Signed)
Erroneous encounter-disregard

## 2023-02-12 ENCOUNTER — Telehealth: Payer: Self-pay | Admitting: Family

## 2023-02-12 NOTE — Telephone Encounter (Signed)
Copied from CRM [id number]. Topic: General - Other >> Feb 12, 2023  3:10 PM Turkey B wrote: Reason for CRM: pt called in had referral for [facility], but states they don't do Dermatology just the allergy testing, so she needs a new referral

## 2023-02-13 ENCOUNTER — Ambulatory Visit (INDEPENDENT_AMBULATORY_CARE_PROVIDER_SITE_OTHER): Payer: Medicaid Other | Admitting: Physician Assistant

## 2023-02-13 ENCOUNTER — Encounter: Payer: Self-pay | Admitting: Physician Assistant

## 2023-02-13 DIAGNOSIS — M23321 Other meniscus derangements, posterior horn of medial meniscus, right knee: Secondary | ICD-10-CM | POA: Diagnosis not present

## 2023-02-13 DIAGNOSIS — R251 Tremor, unspecified: Secondary | ICD-10-CM

## 2023-02-13 NOTE — Progress Notes (Signed)
Office Visit Note   Patient: Nichole Garrett           Date of Birth: 31-Oct-2000           MRN: [id number] Visit Date: 02/13/2023              Requested by: Rema Fendt, NP [address] [place],  [place] [address] PCP: Rema Fendt, NP   Assessment & Plan: Visit Diagnoses: right knee pain                            Upper and lower extremity tremors  Plan: Patient is a pleasant 22 year old woman who comes in for a couple reasons today.  First she comes in with a long history of right knee pain.  She said this has been going on at least over a year.  She denies any injury.  She does get some swelling.  She notices it over the patella tendon and the medial joint line.  She notices it with flexion of her knee in full extension of her knee.  She has been trialed on anti-inflammatories as well as steroid taper packs none of which have helped.  She also comes in with a complaint of bilateral hand numbness although not in a particular distribution.  Denies any back or neck pain.  She also has a long history of upper and lower extremity shaking as she describes it.  She has never been evaluated for this but she says it is getting worse.  She says she cannot control it.  When I examined her today even minimal examination caused her right leg to tremor.  She says it happens in both of her legs.  With regard to her right knee she does have a lot of pain over the patella tendon also medially.  Will order an MRI of the right knee since this has been going on well over a year.  I do have concerns with her upper and lower extremity tremoring that is been going on for 3 to 4 years and increasing in severity and she does not seem to have control of it.  She denies any visual changes.  She also denies any familial history other than history of arthritis in her mother.  I will refer her to neurology to evaluate this.  Follow-Up Instructions: No follow-ups on file.   Orders:  No orders of  the defined types were placed in this encounter.  No orders of the defined types were placed in this encounter.     Procedures: No procedures performed   Clinical Data: No additional findings.   Subjective: No chief complaint on file.   HPI patient is a pleasant 22 year old woman who presents with over a 1 year history of right knee pain.  Denies any injury.  Describes the pain as moderate to severe especially when going up stairs with full extension and she cannot squat down.  Denies any locking or catching.  She also comes in today with a history of numbness and tingling in her hands.  She says it is in all of her hands some time.  Not just to a particular digit.  Denies any neck issues or arm issues.  Also has upper and lower extremity shaking which is been going on for a long time per her mother recollection but is getting worse that she is getting older.  No particular family history to account for this  Review of Systems  All other systems reviewed and are negative.    Objective: Vital Signs: There were no vitals taken for this visit.  Physical Exam Constitutional:      Appearance: Normal appearance.  Pulmonary:     Effort: Pulmonary effort is normal.  Skin:    General: Skin is warm and dry.  Neurological:     General: No focal deficit present.     Mental Status: She is alert and oriented to person, place, and time.  Psychiatric:        Mood and Affect: Mood normal.     Ortho Exam Examination of her right knee no effusion no erythema she has quivering with even light touch to the knee of her whole leg which seems to be uncontrolled.  After a while I am able to examine her she is got a little bit of hypermobility in the knee though none in her upper extremities.  She has strong dorsiflexion plantarflexion extension and flexion of her legs.  Some tenderness over the distal patella tendon.  Good anterior posterior stability she is neurovascular intact compartments are  soft and nontender Specialty Comments:  No specialty comments available.  Imaging: No results found.   PMFS History: Patient Active Problem List   Diagnosis Date Noted   SNORING, HX OF 03/09/2007   History reviewed. No pertinent past medical history.  Family History  Problem Relation Age of Onset   Healthy Mother    Healthy Father     History reviewed. No pertinent surgical history. Social History   Occupational History   Not on file  Tobacco Use   Smoking status: Never   Smokeless tobacco: Never  Substance and Sexual Activity   Alcohol use: Never   Drug use: Never   Sexual activity: Not on file

## 2023-02-16 ENCOUNTER — Telehealth: Payer: Self-pay | Admitting: Family

## 2023-02-16 NOTE — Telephone Encounter (Signed)
Copied from CRM [id number]. Topic: Referral - Request for Referral >> Feb 16, 2023  1:46 PM Everette C wrote: Has patient seen PCP for this complaint? Yes.   *If NO, is insurance requiring patient see PCP for this issue before PCP can refer them? Referral for which specialty: Dermatology  Preferred provider/office: Dr. Latina Craver  Reason for referral: Eczema concern - patient has been directed to request a new referral

## 2023-02-16 NOTE — Telephone Encounter (Signed)
Pt called in states was told by [facility], that referral had to come from [facility] in order for them to see her.

## 2023-02-16 NOTE — Telephone Encounter (Signed)
Called patient no answer will try again at later time

## 2023-02-16 NOTE — Telephone Encounter (Signed)
Called pt and left vm to call office back

## 2023-02-16 NOTE — Telephone Encounter (Unsigned)
Copied from CRM [id number]. Topic: General - Other >> Feb 16, 2023  1:42 PM Santiya F wrote: Reason for CRM: Pt is calling in to check the status of her referral for dermatology. Pt says she reached out to the office that she was referred to and was told the referral had to be sent from a specific person. Please follow up with pt regarding this.

## 2023-02-18 ENCOUNTER — Telehealth: Payer: Self-pay | Admitting: Family

## 2023-02-18 DIAGNOSIS — Z7189 Other specified counseling: Secondary | ICD-10-CM | POA: Diagnosis not present

## 2023-02-18 NOTE — Telephone Encounter (Signed)
Spoke with patient she stated provider needs to send referral to Dillard Cannon who is the Dermatologist intake referral coordinator ,stated [facility] told her this

## 2023-02-18 NOTE — Telephone Encounter (Signed)
Will do!

## 2023-02-18 NOTE — Telephone Encounter (Signed)
Copied from CRM [id number]. Topic: Referral - Request for Referral >> Feb 18, 2023 11:33 AM Franchot Heidelberg wrote: Pt called requesting to be referred for a chemical allergy test, says she was instructed to contact PCP about this at [facility] because they do not do chemical allergy testing

## 2023-02-24 NOTE — Progress Notes (Unsigned)
New Patient Note  RE: Nichole Garrett MRN: [id number] DOB: 08/04/2000 Date of Office Visit: 02/25/2023  Consult requested by: Rema Fendt, NP Primary care provider: Rema Fendt, NP  Chief Complaint: No chief complaint on file.  History of Present Illness: I had the pleasure of seeing Nichole Garrett for initial evaluation at the [facility] on 02/24/2023. She is a 22 y.o. female, who is referred here by Rema Fendt, NP for the evaluation of ***.  Discussed the use of AI scribe software for clinical note transcription with the patient, who gave verbal consent to proceed.  History of Present Illness             ***  Assessment and Plan: Nichole Garrett is a 22 y.o. female with: ***  Assessment and Plan               No follow-ups on file.  No orders of the defined types were placed in this encounter.  Lab Orders  No laboratory test(s) ordered today    Other allergy screening: Asthma: {Blank single:19197::"yes","no"} Rhino conjunctivitis: {Blank single:19197::"yes","no"} Food allergy: {Blank single:19197::"yes","no"} Medication allergy: {Blank single:19197::"yes","no"} Hymenoptera allergy: {Blank single:19197::"yes","no"} Urticaria: {Blank single:19197::"yes","no"} Eczema:{Blank single:19197::"yes","no"} History of recurrent infections suggestive of immunodeficency: {Blank single:19197::"yes","no"}  Diagnostics: Spirometry:  Tracings reviewed. Her effort: {Blank single:19197::"Good reproducible efforts.","It was hard to get consistent efforts and there is a question as to whether this reflects a maximal maneuver.","Poor effort, data can not be interpreted."} FVC: ***L FEV1: ***L, ***% predicted FEV1/FVC ratio: ***% Interpretation: {Blank single:19197::"Spirometry consistent with mild obstructive disease","Spirometry consistent with moderate obstructive disease","Spirometry consistent with severe obstructive  disease","Spirometry consistent with possible restrictive disease","Spirometry consistent with mixed obstructive and restrictive disease","Spirometry uninterpretable due to technique","Spirometry consistent with normal pattern","No overt abnormalities noted given today's efforts"}.  Please see scanned spirometry results for details.  Skin Testing: {Blank single:19197::"Select foods","Environmental allergy panel","Environmental allergy panel and select foods","Food allergy panel","None","Deferred due to recent antihistamines use"}. *** Results discussed with patient/family.   Past Medical History: Patient Active Problem List   Diagnosis Date Noted  . SNORING, HX OF 03/09/2007   No past medical history on file. Past Surgical History: No past surgical history on file. Medication List:  Current Outpatient Medications  Medication Sig Dispense Refill  . benzonatate (TESSALON) 100 MG capsule Take 1 capsule (100 mg total) by mouth every 8 (eight) hours. (Patient not taking: Reported on 11/28/2022) 21 capsule 0  . fluticasone (FLONASE) 50 MCG/ACT nasal spray Place 1 spray into both nostrils daily. (Patient not taking: Reported on 11/28/2022) 18.2 mL 2  . HYDROcodone-acetaminophen (NORCO/VICODIN) 5-325 MG tablet Take 1 tablet by mouth every 6 (six) hours as needed for moderate pain or severe pain. (Patient not taking: Reported on 11/28/2022) 6 tablet 0  . lidocaine (LIDODERM) 5 % Place 1 patch onto the skin daily. Remove & Discard patch within 12 hours or as directed by MD (Patient not taking: Reported on 11/28/2022) 30 patch 0  . loratadine (CLARITIN) 10 MG tablet Take 1 tablet (10 mg total) by mouth daily. (Patient not taking: Reported on 11/28/2022) 30 tablet 0  . methocarbamol (ROBAXIN) 500 MG tablet Take 1 tablet (500 mg total) by mouth every 8 (eight) hours as needed for muscle spasms. (Patient not taking: Reported on 11/28/2022) 12 tablet 0  . naproxen (NAPROSYN) 375 MG tablet Take 1 tablet (375 mg  total) by mouth 2 (two) times daily. (Patient not taking: Reported on 11/28/2022) 20 tablet 0  .  naproxen (NAPROSYN) 375 MG tablet Take 1 tablet (375 mg total) by mouth 2 (two) times daily. (Patient not taking: Reported on 11/28/2022) 20 tablet 0  . omeprazole (PRILOSEC) 20 MG capsule Take 1 capsule (20 mg total) by mouth daily. 90 capsule 0  . ondansetron (ZOFRAN ODT) 8 MG disintegrating tablet Take 1 tablet (8 mg total) by mouth every 8 (eight) hours as needed for nausea or vomiting. (Patient not taking: Reported on 11/28/2022) 12 tablet 0  . penicillin v potassium (VEETID) 500 MG tablet Take 1 tablet (500 mg total) by mouth 3 (three) times daily. (Patient not taking: Reported on 11/28/2022) 30 tablet 0  . predniSONE (DELTASONE) 10 MG tablet Take 3 tabs on days 1-3, 2 tabs on days 4-6, 1 tab on days 7-9 (Patient not taking: Reported on 11/28/2022) 18 tablet 0  . triamcinolone cream (KENALOG) 0.1 % Apply 1 Application topically 2 (two) times daily. (Patient not taking: Reported on 11/28/2022) 30 g 0   No current facility-administered medications for this visit.   Allergies: No Known Allergies Social History: Social History   Socioeconomic History  . Marital status: Single    Spouse name: Not on file  . Number of children: Not on file  . Years of education: Not on file  . Highest education level: Not on file  Occupational History  . Not on file  Tobacco Use  . Smoking status: Never  . Smokeless tobacco: Never  Substance and Sexual Activity  . Alcohol use: Never  . Drug use: Never  . Sexual activity: Not on file  Other Topics Concern  . Not on file  Social History Narrative  . Not on file   Social Determinants of Health   Financial Resource Strain: Not on file  Food Insecurity: Not on file  Transportation Needs: Not on file  Physical Activity: Not on file  Stress: Not on file  Social Connections: Not on file   Lives in a ***. Smoking: *** Occupation: ***  Environmental  HistorySurveyor, minerals in the house: Copywriter, advertising in the family room: {Blank single:19197::"yes","no"} Carpet in the bedroom: {Blank single:19197::"yes","no"} Heating: {Blank single:19197::"electric","gas","heat pump"} Cooling: {Blank single:19197::"central","window","heat pump"} Pet: {Blank single:19197::"yes ***","no"}  Family History: Family History  Problem Relation Age of Onset  . Healthy Mother   . Healthy Father    Problem                               Relation Asthma                                   *** Eczema                                *** Food allergy                          *** Allergic rhino conjunctivitis     ***  Review of Systems  Constitutional:  Negative for appetite change, chills, fever and unexpected weight change.  HENT:  Negative for congestion and rhinorrhea.   Eyes:  Negative for itching.  Respiratory:  Negative for cough, chest tightness, shortness of breath and wheezing.   Cardiovascular:  Negative for chest pain.  Gastrointestinal:  Negative for abdominal pain.  Genitourinary:  Negative for difficulty urinating.  Skin:  Negative for rash.  Neurological:  Negative for headaches.   Objective: There were no vitals taken for this visit. There is no height or weight on file to calculate BMI. Physical Exam Vitals and nursing note reviewed.  Constitutional:      Appearance: Normal appearance. She is well-developed.  HENT:     Head: Normocephalic and atraumatic.     Right Ear: Tympanic membrane and external ear normal.     Left Ear: Tympanic membrane and external ear normal.     Nose: Nose normal.     Mouth/Throat:     Mouth: Mucous membranes are moist.     Pharynx: Oropharynx is clear.  Eyes:     Conjunctiva/sclera: Conjunctivae normal.  Cardiovascular:     Rate and Rhythm: Normal rate and regular rhythm.     Heart sounds: Normal heart sounds. No murmur heard.    No friction rub. No gallop.  Pulmonary:      Effort: Pulmonary effort is normal.     Breath sounds: Normal breath sounds. No wheezing, rhonchi or rales.  Musculoskeletal:     Cervical back: Neck supple.  Skin:    General: Skin is warm.     Findings: No rash.  Neurological:     Mental Status: She is alert and oriented to person, place, and time.  Psychiatric:        Behavior: Behavior normal.  The plan was reviewed with the patient/family, and all questions/concerned were addressed.  It was my pleasure to see Nichole Garrett today and participate in her care. Please feel free to contact me with any questions or concerns.  Sincerely,  Wyline Mood, DO Allergy & Immunology  [facility] office: [phone] [facility] office: [phone]

## 2023-02-25 ENCOUNTER — Other Ambulatory Visit: Payer: Self-pay | Admitting: Family

## 2023-02-25 ENCOUNTER — Other Ambulatory Visit: Payer: Self-pay

## 2023-02-25 ENCOUNTER — Ambulatory Visit (INDEPENDENT_AMBULATORY_CARE_PROVIDER_SITE_OTHER): Payer: Medicaid Other | Admitting: Allergy

## 2023-02-25 ENCOUNTER — Encounter: Payer: Self-pay | Admitting: Allergy

## 2023-02-25 ENCOUNTER — Telehealth: Payer: Self-pay | Admitting: Family

## 2023-02-25 VITALS — BP 120/88 | HR 81 | Temp 98.7°F | Resp 18 | Ht 65.5 in | Wt 202.2 lb

## 2023-02-25 DIAGNOSIS — Z6831 Body mass index (BMI) 31.0-31.9, adult: Secondary | ICD-10-CM | POA: Diagnosis not present

## 2023-02-25 DIAGNOSIS — L509 Urticaria, unspecified: Secondary | ICD-10-CM | POA: Diagnosis not present

## 2023-02-25 DIAGNOSIS — E6609 Other obesity due to excess calories: Secondary | ICD-10-CM | POA: Diagnosis not present

## 2023-02-25 DIAGNOSIS — R21 Rash and other nonspecific skin eruption: Secondary | ICD-10-CM

## 2023-02-25 DIAGNOSIS — Z0182 Encounter for allergy testing: Secondary | ICD-10-CM

## 2023-02-25 DIAGNOSIS — L2089 Other atopic dermatitis: Secondary | ICD-10-CM | POA: Diagnosis not present

## 2023-02-25 DIAGNOSIS — L299 Pruritus, unspecified: Secondary | ICD-10-CM | POA: Diagnosis not present

## 2023-02-25 DIAGNOSIS — R030 Elevated blood-pressure reading, without diagnosis of hypertension: Secondary | ICD-10-CM | POA: Diagnosis not present

## 2023-02-25 DIAGNOSIS — L309 Dermatitis, unspecified: Secondary | ICD-10-CM | POA: Diagnosis not present

## 2023-02-25 DIAGNOSIS — L239 Allergic contact dermatitis, unspecified cause: Secondary | ICD-10-CM | POA: Diagnosis not present

## 2023-02-25 MED ORDER — CETIRIZINE HCL 10 MG PO TABS: 10.0000 mg | ORAL_TABLET | Freq: Every day | ORAL | 2 refills | Status: AC

## 2023-02-25 MED ORDER — TRIAMCINOLONE ACETONIDE 0.1 % EX OINT: 1.0000 | TOPICAL_OINTMENT | Freq: Two times a day (BID) | CUTANEOUS | 1 refills | Status: DC | PRN

## 2023-02-25 NOTE — Telephone Encounter (Signed)
Please confirm with patient if she has been contacted by Dermatology referral for an appointment or if a new referral ordered is needed. Also, confirm if patient would like referral sent to a certain office/provider. Thank you.

## 2023-02-25 NOTE — Patient Instructions (Addendum)
Rash:  Start zyrtec (cetirizine) 10mg  once a day. May take twice a day if needed.  If symptoms are not controlled or causes drowsiness let us know. Avoid the following potential triggers: alcohol, tight clothing, NSAIDs, hot showers and getting overheated.  Get bloodwork. We are ordering labs, so please allow 1-2 weeks for the results to come back. With the newly implemented Cures Act, the labs might be visible to you at the same time that they become visible to me. However, I will not address the results until all of the results are back, so please be patient.   We can do patch testing for chemicals:  Patches are best placed on Monday with return to office on Wednesday and Friday of same week for readings.  Patches once placed should not get wet.  You do not have to stop any medications for patch testing but should not be on oral prednisone. You can schedule a patch testing visit when convenient for your schedule.  Eczema/rash on hands See below for proper skin care. Use fragrance free and dye free products. No dryer sheets or fabric softener.   Use triamcinolone 0.1% ointment twice a day as needed for rash flares. Do not use on the face, neck, armpits or groin area. Do not use more than 3 weeks in a row.      Return in about 6 weeks (around 04/08/2023). Or sooner if needed.  Skin care recommendations  Bath time: Always use lukewarm water. AVOID very hot or cold water. Keep bathing time to 5-10 minutes. Do NOT use bubble bath. Use a mild soap and use just enough to wash the dirty areas. Do NOT scrub skin vigorously.  After bathing, pat dry your skin with a towel. Do NOT rub or scrub the skin.  Moisturizers and prescriptions:  ALWAYS apply moisturizers immediately after bathing (within 3 minutes). This helps to lock-in moisture. Use the moisturizer several times a day over the whole body. Good summer moisturizers include: Aveeno, CeraVe, Cetaphil. Good winter moisturizers include:  Aquaphor, Vaseline, Cerave, Cetaphil, Eucerin, Vanicream. When using moisturizers along with medications, the moisturizer should be applied about one hour after applying the medication to prevent diluting effect of the medication or moisturize around where you applied the medications. When not using medications, the moisturizer can be continued twice daily as maintenance.  Laundry and clothing: Avoid laundry products with added color or perfumes. Use unscented hypo-allergenic laundry products such as Tide free, Cheer free & gentle, and All free and clear.  If the skin still seems dry or sensitive, you can try double-rinsing the clothes. Avoid tight or scratchy clothing such as wool. Do not use fabric softeners or dyer sheets.

## 2023-02-25 NOTE — Telephone Encounter (Signed)
Please refer to note from referral coordinator on 02/18/2023 at 12:34 pm. Thank you.

## 2023-02-25 NOTE — Telephone Encounter (Signed)
Left voicemail to call office back so I can go over referral with her.

## 2023-02-25 NOTE — Telephone Encounter (Signed)
Copied from CRM [id number]. Topic: General - Inquiry >> Feb 25, 2023  1:21 PM Marlow Baars wrote: Reason for CRM: The patient called in stating she unfortunately does not need the referral anymore to the Allergist. She said it was taking too long and she called herself and made an appointment elsewhere. She is seeing an Allergist today at 2. I also informed her a referral was being resent to [facility] per her request.

## 2023-02-25 NOTE — Telephone Encounter (Signed)
Noted  

## 2023-02-25 NOTE — Telephone Encounter (Signed)
Referral to Allergy. Expect call soon with appointment details.

## 2023-02-26 LAB — CBC WITH DIFFERENTIAL/PLATELET
Basophils Absolute: 0 10*3/uL (ref 0.0–0.2)
Basos: 0 %
EOS (ABSOLUTE): 0.1 10*3/uL (ref 0.0–0.4)
Eos: 2 %
Hematocrit: 41.1 % (ref 34.0–46.6)
Hemoglobin: 13.6 g/dL (ref 11.1–15.9)
Immature Grans (Abs): 0 10*3/uL (ref 0.0–0.1)
Immature Granulocytes: 0 %
Lymphocytes Absolute: 2 10*3/uL (ref 0.7–3.1)
Lymphs: 44 %
MCH: 31.2 pg (ref 26.6–33.0)
MCHC: 33.1 g/dL (ref 31.5–35.7)
MCV: 94 fL (ref 79–97)
Monocytes Absolute: 0.4 10*3/uL (ref 0.1–0.9)
Monocytes: 8 %
Neutrophils Absolute: 2 10*3/uL (ref 1.4–7.0)
Neutrophils: 46 %
Platelets: 318 10*3/uL (ref 150–450)
RBC: 4.36 x10E6/uL (ref 3.77–5.28)
RDW: 13.2 % (ref 11.7–15.4)
WBC: 4.5 10*3/uL (ref 3.4–10.8)

## 2023-02-26 LAB — COMPREHENSIVE METABOLIC PANEL
ALT: 11 IU/L (ref 0–32)
AST: 12 IU/L (ref 0–40)
Albumin: 4.1 g/dL (ref 4.0–5.0)
Alkaline Phosphatase: 54 IU/L (ref 44–121)
BUN/Creatinine Ratio: 15 (ref 9–23)
BUN: 14 mg/dL (ref 6–20)
Bilirubin Total: 1 mg/dL (ref 0.0–1.2)
CO2: 21 mmol/L (ref 20–29)
Calcium: 9.5 mg/dL (ref 8.7–10.2)
Chloride: 102 mmol/L (ref 96–106)
Creatinine, Ser: 0.91 mg/dL (ref 0.57–1.00)
Globulin, Total: 3.6 g/dL (ref 1.5–4.5)
Glucose: 79 mg/dL (ref 70–99)
Potassium: 4.1 mmol/L (ref 3.5–5.2)
Sodium: 138 mmol/L (ref 134–144)
Total Protein: 7.7 g/dL (ref 6.0–8.5)
eGFR: 91 mL/min/{1.73_m2} (ref >59)

## 2023-02-26 LAB — C-REACTIVE PROTEIN: CRP: 2 mg/L (ref 0–10)

## 2023-02-26 LAB — THYROID CASCADE PROFILE: TSH: 1.1 u[IU]/mL (ref 0.450–4.500)

## 2023-02-26 LAB — ALPHA-GAL PANEL

## 2023-02-26 LAB — C3 AND C4
Complement C3, Serum: 165 mg/dL (ref 82–167)
Complement C4, Serum: 22 mg/dL (ref 12–38)

## 2023-02-26 LAB — SEDIMENTATION RATE: Sed Rate: 26 mm/hr (ref 0–32)

## 2023-02-27 DIAGNOSIS — H5213 Myopia, bilateral: Secondary | ICD-10-CM | POA: Diagnosis not present

## 2023-02-27 NOTE — Telephone Encounter (Signed)
I sent her a My Chart message about this.

## 2023-03-02 ENCOUNTER — Encounter: Payer: Self-pay | Admitting: Family Medicine

## 2023-03-02 ENCOUNTER — Other Ambulatory Visit: Payer: Self-pay

## 2023-03-02 ENCOUNTER — Ambulatory Visit (INDEPENDENT_AMBULATORY_CARE_PROVIDER_SITE_OTHER): Payer: Medicaid Other | Admitting: Family Medicine

## 2023-03-02 VITALS — BP 118/70 | HR 81 | Temp 98.7°F | Resp 12 | Wt 203.9 lb

## 2023-03-02 DIAGNOSIS — L235 Allergic contact dermatitis due to other chemical products: Secondary | ICD-10-CM | POA: Diagnosis not present

## 2023-03-02 DIAGNOSIS — L259 Unspecified contact dermatitis, unspecified cause: Secondary | ICD-10-CM | POA: Insufficient documentation

## 2023-03-02 NOTE — Progress Notes (Signed)
Follow-up Note  RE: Nichole Garrett MRN: [id number] DOB: June 18, 2000 Date of Office Visit: 03/02/2023  Primary care provider: Rema Fendt, NP Referring provider: Rema Fendt, NP   [name] returns to the office today for the patch test placement, given suspected history of contact dermatitis. She reports that she has stopped using any soaps over the last several days and reports that she has not had any breakouts with the use of just water. We discussed how patch testing works in detail and all questions were answered before patch placement. She has not started using the triamcinolone ointment at this time.   Diagnostics: True Test patches placed.   Plan:   Allergic contact dermatitis - Instructions provided on care of the patches for the next 48 hours. [name] was instructed to avoid showering for the next 48 hours. [name] will follow up in 48 hours and 96 hours for patch readings.    Call the clinic if this treatment plan is not working well for you  Follow up in 2 days or sooner if needed.

## 2023-03-02 NOTE — Patient Instructions (Signed)
Diagnostics: True Test patches placed.   Plan:   Allergic contact dermatitis - Instructions provided on care of the patches for the next 48 hours. Nichole Garrett was instructed to avoid showering for the next 48 hours. Nichole Garrett will follow up in 48 hours and 96 hours for patch readings.    Call the clinic if this treatment plan is not working well for you  Follow up in 2 days or sooner if needed.

## 2023-03-04 ENCOUNTER — Ambulatory Visit (INDEPENDENT_AMBULATORY_CARE_PROVIDER_SITE_OTHER): Payer: Medicaid Other | Admitting: Family

## 2023-03-04 ENCOUNTER — Encounter: Payer: Self-pay | Admitting: Family

## 2023-03-04 DIAGNOSIS — L235 Allergic contact dermatitis due to other chemical products: Secondary | ICD-10-CM

## 2023-03-04 LAB — ANA W/REFLEX: Anti Nuclear Antibody (ANA): NEGATIVE

## 2023-03-04 LAB — TRYPTASE: Tryptase: 4.1 ug/L (ref 2.2–13.2)

## 2023-03-04 LAB — ALPHA-GAL PANEL: Pork IgE: <0.1 kU/L

## 2023-03-04 LAB — CHRONIC URTICARIA

## 2023-03-04 NOTE — Progress Notes (Signed)
Nichole Garrett returns to the office today for the 48 hour patch test interpretation, given suspected history of contact dermatitis.   She reports itching on her back since placement of true patch testing.   Diagnostics:   TRUE TEST 48-hour hour reading: all negative   Plan:   Allergic contact dermatitis - 48 hour reading all negative. -May take an over the antihistamine such as Zyrtec, Allegra, Xyzal, or Claritin once a day as needed for itching.  Caution as these may make you sleepy. -Keep appointment on Friday for final patch reading.  Nehemiah Settle, FNP [facility]

## 2023-03-06 ENCOUNTER — Other Ambulatory Visit: Payer: Medicaid Other

## 2023-03-06 ENCOUNTER — Encounter: Payer: Medicaid Other | Admitting: Internal Medicine

## 2023-03-24 ENCOUNTER — Telehealth: Payer: Self-pay | Admitting: Physician Assistant

## 2023-03-24 NOTE — Telephone Encounter (Signed)
Pt called requesting [facility] [employer] to send in supporting documents for pt to appeal denial of MRI from [facility]. Please contact pt about this matter at [phone]. Sometime today call pt.

## 2023-04-01 NOTE — Telephone Encounter (Signed)
Refaxed clinicals to [facility] community as requested

## 2023-04-08 ENCOUNTER — Ambulatory Visit: Payer: Medicaid Other | Admitting: Allergy

## 2023-04-08 NOTE — Progress Notes (Deleted)
Follow Up Note  RE: Nichole Garrett MRN: [id number] DOB: 2000/10/14 Date of Office Visit: 04/08/2023  Referring provider: Rema Fendt, NP Primary care provider: Rema Fendt, NP  Chief Complaint: No chief complaint on file.  History of Present Illness: I had the pleasure of seeing Nichole Garrett for a follow up visit at the [facility] on 04/08/2023. She is a 22 y.o. female, who is being followed for urticaria, atopic dermatitis. Her previous allergy office visit was on 03/04/2023 with Nichole Leyland, FNP. Today is a regular follow up visit.  Discussed the use of AI scribe software for clinical note transcription with the patient, who gave verbal consent to proceed.  History of Present Illness          Blood count, kidney function, liver function, electrolytes, thyroid, autoimmune screener, inflammation markers, chronic urticaria index (checks for autoantibodies that trigger mast cells), tryptase (checks for mast cell issues) and alpha gal (checks for red meat allergy) were all normal which is great.   Based on these results no other issues. We will see what the patch testing results show next.  Why did she not come back for second patch testing read?  Urticaria Recurrent hives after using Dove soap for the past year. Hives resolve within 10-30 minutes. No other associated symptoms. No similar reaction with other products. Negative environmental allergy test. Start zyrtec (cetirizine) 10mg  once a day. May take twice a day if needed.  If symptoms are not controlled or causes drowsiness let us know. Avoid the following potential triggers: alcohol, tight clothing, NSAIDs, hot showers and getting overheated. Get bloodwork. Offered TRUE patch testing if interested but typically contact dermatitis does not present as above.    Other atopic dermatitis Longstanding history of eczema, managed with over-the-counter topicals. Recent rash on hands, different  from usual eczema presentation. See below for proper skin care. Use fragrance free and dye free products. No dryer sheets or fabric softener.   Use triamcinolone 0.1% ointment twice a day as needed for rash flares. Do not use on the face, neck, armpits or groin area. Do not use more than 3 weeks in a row.   Assessment and Plan: Nichole Garrett is a 22 y.o. female with: *** Assessment and Plan              No follow-ups on file.  No orders of the defined types were placed in this encounter.  Lab Orders  No laboratory test(s) ordered today    Diagnostics: Spirometry:  Tracings reviewed. Her effort: {Blank single:19197::"Good reproducible efforts.","It was hard to get consistent efforts and there is a question as to whether this reflects a maximal maneuver.","Poor effort, data can not be interpreted."} FVC: ***L FEV1: ***L, ***% predicted FEV1/FVC ratio: ***% Interpretation: {Blank single:19197::"Spirometry consistent with mild obstructive disease","Spirometry consistent with moderate obstructive disease","Spirometry consistent with severe obstructive disease","Spirometry consistent with possible restrictive disease","Spirometry consistent with mixed obstructive and restrictive disease","Spirometry uninterpretable due to technique","Spirometry consistent with normal pattern","No overt abnormalities noted given today's efforts"}.  Please see scanned spirometry results for details.  Skin Testing: {Blank single:19197::"Select foods","Environmental allergy panel","Environmental allergy panel and select foods","Food allergy panel","None","Deferred due to recent antihistamines use"}. *** Results discussed with patient/family.   Medication List:  Current Outpatient Medications  Medication Sig Dispense Refill   cetirizine (ZYRTEC ALLERGY) 10 MG tablet Take 1 tablet (10 mg total) by mouth at bedtime. 30 tablet 2   omeprazole (PRILOSEC) 20 MG capsule Take 20 mg by mouth  daily.      triamcinolone ointment (KENALOG) 0.1 % Apply 1 Application topically 2 (two) times daily as needed (rash flare). Do not use on the face, neck, armpits or groin area. Do not use more than 3 weeks in a row. 30 g 1   No current facility-administered medications for this visit.   Allergies: No Known Allergies I reviewed her past medical history, social history, family history, and environmental history and no significant changes have been reported from her previous visit.  Review of Systems  Constitutional:  Negative for appetite change, chills, fever and unexpected weight change.  HENT:  Negative for congestion and rhinorrhea.   Eyes:  Negative for itching.  Respiratory:  Negative for cough, chest tightness, shortness of breath and wheezing.   Cardiovascular:  Negative for chest pain.  Gastrointestinal:  Negative for abdominal pain.  Genitourinary:  Negative for difficulty urinating.  Skin:  Positive for rash.  Neurological:  Negative for headaches.    Objective: There were no vitals taken for this visit. There is no height or weight on file to calculate BMI. Physical Exam Vitals and nursing note reviewed.  Constitutional:      Appearance: Normal appearance. She is well-developed.  HENT:     Head: Normocephalic and atraumatic.     Right Ear: Tympanic membrane and external ear normal.     Left Ear: Tympanic membrane and external ear normal.     Nose: Nose normal.     Mouth/Throat:     Mouth: Mucous membranes are moist.     Pharynx: Oropharynx is clear.  Eyes:     Conjunctiva/sclera: Conjunctivae normal.  Cardiovascular:     Rate and Rhythm: Normal rate and regular rhythm.     Heart sounds: Normal heart sounds. No murmur heard.    No friction rub. No gallop.  Pulmonary:     Effort: Pulmonary effort is normal.     Breath sounds: Normal breath sounds. No wheezing, rhonchi or rales.  Musculoskeletal:     Cervical back: Neck supple.  Skin:    General: Skin is warm.      Findings: Rash present.     Comments: Dry, patches on dorsal aspect of hand with some discoloration b/l.  Neurological:     Mental Status: She is alert and oriented to person, place, and time.  Psychiatric:        Behavior: Behavior normal.    Previous notes and tests were reviewed. The plan was reviewed with the patient/family, and all questions/concerned were addressed.  It was my pleasure to see Nichole Garrett today and participate in her care. Please feel free to contact me with any questions or concerns.  Sincerely,  Wyline Mood, DO Allergy & Immunology  [facility] office: [phone] [facility] office: [phone]

## 2023-05-25 ENCOUNTER — Encounter: Payer: Self-pay | Admitting: Family

## 2023-06-15 ENCOUNTER — Encounter: Payer: Medicaid Other | Admitting: Family

## 2023-06-15 NOTE — Progress Notes (Signed)
 Erroneous encounter-disregard

## 2023-07-13 ENCOUNTER — Encounter: Payer: Medicaid Other | Admitting: Family

## 2023-07-13 NOTE — Progress Notes (Signed)
 Erroneous encounter-disregard

## 2023-07-28 ENCOUNTER — Encounter: Payer: Self-pay | Admitting: Family

## 2023-07-28 ENCOUNTER — Telehealth: Payer: Self-pay | Admitting: Family

## 2023-07-28 ENCOUNTER — Encounter: Payer: Medicaid Other | Admitting: Family

## 2023-07-28 NOTE — Progress Notes (Signed)
 Erroneous encounter-disregard

## 2023-07-28 NOTE — Telephone Encounter (Signed)
 Sent patient my chart message about no show and to call office to get rescheduled

## 2023-08-19 ENCOUNTER — Encounter: Payer: Medicaid Other | Admitting: Family

## 2023-08-19 NOTE — Progress Notes (Signed)
 Erroneous encounter-disregard

## 2024-05-27 ENCOUNTER — Encounter (HOSPITAL_COMMUNITY): Payer: Self-pay | Admitting: *Deleted

## 2024-05-27 ENCOUNTER — Other Ambulatory Visit: Payer: Self-pay

## 2024-05-27 ENCOUNTER — Ambulatory Visit (HOSPITAL_COMMUNITY)
Admission: EM | Admit: 2024-05-27 | Discharge: 2024-05-27 | Disposition: A | Discharge status: Home / Self Care | Attending: Internal Medicine | Admitting: Internal Medicine

## 2024-05-27 DIAGNOSIS — J069 Acute upper respiratory infection, unspecified: Secondary | ICD-10-CM | POA: Diagnosis not present

## 2024-05-27 LAB — POC COVID19/FLU A&B COMBO
Covid Antigen, POC: NEGATIVE
Influenza A Antigen, POC: NEGATIVE
Influenza B Antigen, POC: NEGATIVE

## 2024-05-27 MED ORDER — GUAIFENESIN ER 1200 MG PO TB12: 1200.0000 mg | ORAL_TABLET | Freq: Two times a day (BID) | ORAL | 0 refills | Status: DC

## 2024-05-27 MED ORDER — ACETAMINOPHEN 325 MG PO TABS
ORAL_TABLET | ORAL | Status: AC
  Filled 2024-05-27: qty 3

## 2024-05-27 MED ORDER — ACETAMINOPHEN 325 MG PO TABS
975.0000 mg | ORAL_TABLET | Freq: Once | ORAL | Status: AC
  Administered 2024-05-27: 975 mg via ORAL

## 2024-05-27 MED ORDER — BENZONATATE 100 MG PO CAPS: 100.0000 mg | ORAL_CAPSULE | Freq: Three times a day (TID) | ORAL | 0 refills | Status: DC

## 2024-05-27 MED ORDER — PROMETHAZINE-DM 6.25-15 MG/5ML PO SYRP: 5.0000 mL | ORAL_SOLUTION | Freq: Every evening | ORAL | 0 refills | Status: DC | PRN

## 2024-05-27 NOTE — ED Provider Notes (Signed)
 " [facility]    CSN: [id number] Arrival date & time: 05/27/24  0803      History   Chief Complaint Chief Complaint  Patient presents with   Cough    HPI Nichole Garrett is a 23 y.o. female.   Nichole Garrett is a 23 y.o. female presenting for chief complaint of Cough, nasal congestion, sore throat, and generalized fatigue that started 3 days ago on May 24, 2024.  Cough started out as dry and has now become more productive with brown/clear sputum.  She denies nausea, vomiting, diarrhea, abdominal pain, rashes, shortness of breath, chest pain, heart palpitations, and history of chronic respiratory problems.  Never smoker, denies drug use.  She has had chills at home without documented fever, she has not measured her temperature at home.  Took ibuprofen  yesterday, she has not had any antipyretics in the last 8 hours.  Currently febrile at 100.6.   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 No pertinent surgical history.  OB History   No obstetric history on file.      Home Medications    Prior to Admission medications  Medication Sig Start Date End Date Taking? Authorizing Provider  omeprazole  (PRILOSEC) 20 MG capsule Take 20 mg by mouth daily.   Yes [provider]  cetirizine  (ZYRTEC  ALLERGY) 10 MG tablet Take 1 tablet (10 mg total) by mouth at bedtime. 02/25/23   Luke Orlan CHRISTELLA, DO  triamcinolone  ointment (KENALOG ) 0.1 % Apply 1 Application topically 2 (two) times daily as needed (rash flare). Do not use on the face, neck, armpits or groin area. Do not use more than 3 weeks in a row. 02/25/23   Luke Orlan CHRISTELLA, DO    Family History Family History  Problem Relation Age of Onset   Healthy Mother    Healthy Father     Social History Social History[1]   Allergies    Patient has no known allergies.   Review of Systems Review of Systems  Respiratory:  Positive for cough.   Per HPI   Physical Exam Triage Vital Signs ED Triage Vitals  Encounter Vitals Group     BP 05/27/24 0820 (!) 128/94     Girls Systolic BP Percentile --      Girls Diastolic BP Percentile --      Boys Systolic BP Percentile --      Boys Diastolic BP Percentile --      Pulse Rate 05/27/24 0820 (!) 106     Resp 05/27/24 0820 20     Temp 05/27/24 0820 (!) 100.6 F (38.1 C)     Temp src --      SpO2 05/27/24 0820 97 %     Weight --      Height --      Head Circumference --      Peak Flow --      Pain Score 05/27/24 0817 0     Pain Loc --      Pain Education --      Exclude from Growth Chart --    No data found.  Updated Vital Signs BP (!) 128/94   Pulse (!) 106   Temp (!) 100.6 F (38.1 C)   Resp 20   LMP 05/27/2024  SpO2 97%   Visual Acuity Right Eye Distance:   Left Eye Distance:   Bilateral Distance:    Right Eye Near:   Left Eye Near:    Bilateral Near:     Physical Exam Vitals and nursing note reviewed.  Constitutional:      Appearance: She is not ill-appearing or toxic-appearing.  HENT:     Head: Normocephalic and atraumatic.     Right Ear: Hearing, tympanic membrane, ear canal and external ear normal.     Left Ear: Hearing, tympanic membrane, ear canal and external ear normal.     Nose: Congestion present.     Mouth/Throat:     Lips: Pink.     Mouth: Mucous membranes are moist. No injury or oral lesions.     Dentition: Normal dentition.     Tongue: No lesions.     Pharynx: Oropharynx is clear. Uvula midline. Posterior oropharyngeal erythema present. No pharyngeal swelling, oropharyngeal exudate, uvula swelling or postnasal drip.     Tonsils: No tonsillar exudate.     Comments: Scant erythema to the bilateral tonsillar pillars without tonsillar hypertrophy. Eyes:     General: Lids are normal. Vision grossly intact. Gaze aligned  appropriately.     Extraocular Movements: Extraocular movements intact.     Conjunctiva/sclera: Conjunctivae normal.  Neck:     Trachea: Trachea and phonation normal.  Cardiovascular:     Rate and Rhythm: Normal rate and regular rhythm.     Heart sounds: Normal heart sounds, S1 normal and S2 normal.  Pulmonary:     Effort: Pulmonary effort is normal. No respiratory distress.     Breath sounds: Normal breath sounds and air entry.  Musculoskeletal:     Cervical back: Neck supple.  Lymphadenopathy:     Cervical: Cervical adenopathy present.  Skin:    General: Skin is warm and dry.     Capillary Refill: Capillary refill takes less than 2 seconds.     Findings: No rash.  Neurological:     General: No focal deficit present.     Mental Status: She is alert and oriented to person, place, and time. Mental status is at baseline.     Cranial Nerves: No dysarthria or facial asymmetry.  Psychiatric:        Mood and Affect: Mood normal.        Speech: Speech normal.        Behavior: Behavior normal.        Thought Content: Thought content normal.        Judgment: Judgment normal.      UC Treatments / Results  Labs (all labs ordered are listed, but only abnormal results are displayed) Labs Reviewed  POC COVID19/FLU A&B COMBO    EKG   Radiology No results found.  Procedures Procedures (including critical care time)  Medications Ordered in UC Medications  acetaminophen  (TYLENOL ) tablet 975 mg (975 mg Oral Given 05/27/24 0840)    Initial Impression / Assessment and Plan / UC Course  I have reviewed the triage vital signs and the nursing notes.  Pertinent labs & imaging results that were available during my care of the patient were reviewed by me and considered in my medical decision making (see chart for details).   1.  Viral URI with cough Suspect viral URI, viral syndrome.  Strep/viral testing: Point-of-care COVID-19 and influenza testing are both negative. Tylenol  975mg   given in clinic for fever.  Physical exam findings reassuring, vital signs hemodynamically stable, and lungs  clear, therefore deferred imaging of the chest.  Advised supportive care/prescriptions for symptomatic relief as outlined in AVS.    Counseled patient on potential for adverse effects with medications prescribed/recommended today, strict ER and return-to-clinic precautions discussed, patient verbalized understanding.    Final Clinical Impressions(s) / UC Diagnoses   Final diagnoses:  Viral URI with cough     Discharge Instructions      You have a viral illness which will improve on its own with rest, fluids, and medications to help with your symptoms.  Tylenol , guaifenesin  (plain mucinex ), and saline nasal sprays may help relieve symptoms.   Two teaspoons of honey in 1 cup of warm water every 4-6 hours may help with throat pains.  Humidifier in room at nighttime may help soothe cough (clean well daily).   Take Promethazine  DM cough medication to help with your cough at nighttime so that you are able to sleep. Do not drive, drink alcohol, or go to work while taking this medication since it can make you sleepy. Only take this at nighttime.   For chest pain, shortness of breath, inability to keep food or fluids down without vomiting, fever that does not respond to tylenol  or motrin , or any other severe symptoms, please go to the ER for further evaluation. Return to urgent care as needed, otherwise follow-up with PCP.       ED Prescriptions   None    PDMP not reviewed this encounter.    [1]  Social History Tobacco Use   Smoking status: Never    Passive exposure: Never   Smokeless tobacco: Never  Vaping Use   Vaping status: Never Used  Substance Use Topics   Alcohol use: Never   Drug use: Never     Enedelia Dorna HERO, FNP 05/27/24 [phone]  "

## 2024-05-27 NOTE — Discharge Instructions (Signed)

## 2024-05-27 NOTE — ED Triage Notes (Signed)
 PT reports a dry cough that started 3 days ago . The cough has progressed to productive cough. Pt reports her sister had PNA. Pt denies a fever.

## 2024-05-29 ENCOUNTER — Encounter (HOSPITAL_COMMUNITY): Payer: Self-pay | Admitting: *Deleted

## 2024-05-29 ENCOUNTER — Ambulatory Visit (HOSPITAL_COMMUNITY)

## 2024-05-29 ENCOUNTER — Ambulatory Visit (HOSPITAL_COMMUNITY)
Admission: EM | Admit: 2024-05-29 | Discharge: 2024-05-29 | Disposition: A | Discharge status: Home / Self Care | Attending: Nurse Practitioner | Admitting: Nurse Practitioner

## 2024-05-29 ENCOUNTER — Other Ambulatory Visit: Payer: Self-pay

## 2024-05-29 DIAGNOSIS — R051 Acute cough: Secondary | ICD-10-CM

## 2024-05-29 DIAGNOSIS — J069 Acute upper respiratory infection, unspecified: Secondary | ICD-10-CM | POA: Diagnosis not present

## 2024-05-29 MED ORDER — FLUTICASONE PROPIONATE 50 MCG/ACT NA SUSP: 2.0000 | Freq: Every day | NASAL | 0 refills | Status: AC

## 2024-05-29 MED ORDER — ALBUTEROL SULFATE HFA 108 (90 BASE) MCG/ACT IN AERS: 2.0000 | INHALATION_SPRAY | RESPIRATORY_TRACT | 0 refills | Status: AC | PRN

## 2024-05-29 MED ORDER — DEXAMETHASONE SOD PHOSPHATE PF 10 MG/ML IJ SOLN
10.0000 mg | Freq: Once | INTRAMUSCULAR | Status: AC
  Administered 2024-05-29: 10 mg via INTRAMUSCULAR

## 2024-05-29 MED ORDER — PREDNISONE 20 MG PO TABS: 40.0000 mg | ORAL_TABLET | Freq: Every day | ORAL | 0 refills | Status: AC

## 2024-05-29 MED ORDER — IPRATROPIUM-ALBUTEROL 0.5-2.5 (3) MG/3ML IN SOLN
RESPIRATORY_TRACT | Status: AC
  Filled 2024-05-29: qty 3

## 2024-05-29 MED ORDER — AZITHROMYCIN 250 MG PO TABS: 250.0000 mg | ORAL_TABLET | Freq: Every day | ORAL | 0 refills | Status: AC

## 2024-05-29 MED ORDER — IPRATROPIUM-ALBUTEROL 0.5-2.5 (3) MG/3ML IN SOLN
3.0000 mL | Freq: Once | RESPIRATORY_TRACT | Status: AC
  Administered 2024-05-29: 3 mL via RESPIRATORY_TRACT

## 2024-05-29 NOTE — Discharge Instructions (Addendum)
 You were seen today for worsening upper respiratory symptoms. Your chest X-ray is normal, and there are no signs of pneumonia. Your symptoms are most consistent with a significant upper respiratory infection that has not improved with time and supportive care alone.  To help with your symptoms, you were given a breathing treatment and a steroid injection today to reduce inflammation and help loosen mucus. Take the prescribed medications as directed, including the prednisone , azithromycin , Flonase  nasal spray, and albuterol  inhaler as needed for coughing or shortness of breath. You may continue using the cough medications you have already. Use a humidifier at home and avoid dry air, as this can worsen congestion and throat irritation. Stay well hydrated with small, frequent sips of fluids if large amounts are difficult to tolerate. Replace your toothbrush after completing the antibiotic course.  Follow up with your primary care provider if your symptoms are not clearly improving over the next few days or if you continue to have difficulty eating or drinking. Seek emergency care immediately if you develop worsening shortness of breath, chest pain, persistent vomiting or inability to keep fluids down, confusion, fainting, or a fever that continues to rise or does not improve with medication.

## 2024-05-29 NOTE — ED Provider Notes (Signed)
 " [facility]    CSN: [id number] Arrival date & time: 05/29/24  1607      History   Chief Complaint Chief Complaint  Patient presents with   nasal congeation   dry throat   Cough    HPI Nichole Garrett is a 23 y.o. female.   Discussed the use of AI scribe software for clinical note transcription with the patient, who gave verbal consent to proceed.   The patient returns to urgent care with worsening upper respiratory symptoms that initially began on December 23rd. She was previously evaluated on December 26th after three days of symptoms including cough, nasal congestion, sore throat, and fatigue. At that time, her cough had progressed from dry to productive and was associated with chills. Influenza and COVID testing were negative, and she was diagnosed with a viral illness.  Since her prior visit, the patient reports significant progression of symptoms. Her cough has markedly worsened and is now forceful enough to cause rib pain. She describes a sensation of thick mucus stuck in her throat, resulting in gagging and choking when attempting to clear secretions. She reports new-onset nausea since starting medications prescribed at her last visit and states she has been unable to tolerate oral intake, with difficulty keeping food down. She also reports severe dry mouth despite attempting hydration with water, sports drinks, and orange juice.  The patient reports ongoing fevers, with a temperature of 100.6F documented today, similar to her prior visit, and notes waking during the night with sweats. She continues to experience significant nasal congestion and sinus pressure. She attempted a home remedy of warm water with honey for throat discomfort, which she felt worsened her symptoms. She endorses mild shortness of breath with exertion, such as walking or bending to clean, and reports dizziness and nausea when using hot showers for steam therapy.  She denies tobacco use,  including smoking or vaping. She denies lower extremity swelling, palpitations, or sensation of a racing heart. She reports she initially used a humidifier at the onset of illness but has since discontinued its use.  The following sections of the patient's history were reviewed and updated as appropriate: allergies, current medications, past family history, past medical history, past social history, past surgical history, and problem list.     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 No pertinent surgical history.  OB History   No obstetric history on file.      Home Medications    Prior to Admission medications  Medication Sig Start Date End Date Taking? Authorizing Provider  albuterol  (VENTOLIN  HFA) 108 (90 Base) MCG/ACT inhaler Inhale 2 puffs into the lungs every 4 (four) hours as needed for wheezing or shortness of breath (bronchospasms). 05/29/24  Yes Iola Lukes, FNP  azithromycin  (ZITHROMAX ) 250 MG tablet Take 1 tablet (250 mg total) by mouth daily. Take first 2 tablets together, then 1 every day until finished. 05/29/24  Yes Iola Lukes, FNP  cetirizine  (ZYRTEC  ALLERGY) 10 MG tablet Take 1 tablet (10 mg total) by mouth at bedtime. 02/25/23  Yes Luke Orlan CHRISTELLA, DO  fluticasone  (FLONASE ) 50 MCG/ACT nasal spray Place 2 sprays into both nostrils daily. Shake well before use. Gently blow nose before spraying. Do not blow nose immediately after use. You should not taste the medication  or feel it going down your throat; if you do, adjust your technique. 05/29/24  Yes Lycan Davee, FNP  predniSONE  (DELTASONE ) 20 MG tablet Take 2 tablets (40 mg total) by mouth daily for 5 days. 05/29/24 06/03/24 Yes Iola Lukes, FNP    Family History Family History  Problem Relation Age of Onset   Healthy Mother    Healthy  Father     Social History Social History[1]   Allergies   Patient has no known allergies.   Review of Systems Review of Systems  Constitutional:  Positive for appetite change, chills and diaphoresis. Negative for fever.  HENT:  Positive for congestion, rhinorrhea (only after sneezing) and sneezing. Negative for postnasal drip and sore throat.        Feels like mucus is stuck in back of throat. Mouth is very dry.   Respiratory:  Positive for cough, choking (and gagging trying to cough up sputum) and shortness of breath (mild, intermittent). Negative for wheezing.   Cardiovascular:  Negative for leg swelling.  Gastrointestinal:  Positive for nausea. Negative for diarrhea and vomiting.  Musculoskeletal:  Positive for myalgias (back and chest pain due to coughing).  Neurological:  Positive for headaches.  All other systems reviewed and are negative.    Physical Exam Triage Vital Signs ED Triage Vitals  Encounter Vitals Group     BP 05/29/24 1741 121/86     Girls Systolic BP Percentile --      Girls Diastolic BP Percentile --      Boys Systolic BP Percentile --      Boys Diastolic BP Percentile --      Pulse Rate 05/29/24 1741 (!) 103     Resp 05/29/24 1741 20     Temp 05/29/24 1741 (!) 100.5 F (38.1 C)     Temp Source 05/29/24 1900 Oral     SpO2 05/29/24 1741 99 %     Weight --      Height --      Head Circumference --      Peak Flow --      Pain Score 05/29/24 1738 8     Pain Loc --      Pain Education --      Exclude from Growth Chart --    No data found.  Updated Vital Signs BP 121/86   Pulse (!) 103   Temp 98.9 F (37.2 C) (Oral)   Resp 20   LMP 05/27/2024   SpO2 99%   Visual Acuity Right Eye Distance:   Left Eye Distance:   Bilateral Distance:    Right Eye Near:   Left Eye Near:    Bilateral Near:     Physical Exam Vitals reviewed.  Constitutional:      General: She is awake. She is not in acute distress.    Appearance: Normal appearance. She  is well-developed. She is ill-appearing. She is not toxic-appearing or diaphoretic.  HENT:     Head: Normocephalic.     Right Ear: Hearing, tympanic membrane, ear canal and external ear normal. No drainage, swelling or tenderness. No middle ear effusion. Tympanic membrane is not erythematous.     Left Ear: Hearing, tympanic membrane, ear canal and external ear normal. No drainage, swelling or tenderness.  No middle ear effusion. Tympanic membrane is not erythematous.     Nose: Congestion present.     Mouth/Throat:     Lips: Pink.     Mouth: Mucous membranes are moist.  Pharynx: Oropharynx is clear. Uvula midline. Posterior oropharyngeal erythema present. No pharyngeal swelling, oropharyngeal exudate or uvula swelling.     Tonsils: No tonsillar exudate or tonsillar abscesses.  Eyes:     General: Vision grossly intact.     Conjunctiva/sclera: Conjunctivae normal.  Cardiovascular:     Rate and Rhythm: Normal rate.     Heart sounds: Normal heart sounds.  Pulmonary:     Effort: Pulmonary effort is normal. No tachypnea or respiratory distress.     Breath sounds: Normal breath sounds and air entry.     Comments: Respirations even and unlabored  Musculoskeletal:        General: Normal range of motion.     Cervical back: Normal range of motion and neck supple.  Lymphadenopathy:     Cervical: Cervical adenopathy present.  Skin:    General: Skin is warm and dry.  Neurological:     General: No focal deficit present.     Mental Status: She is alert and oriented to person, place, and time.  Psychiatric:        Behavior: Behavior is cooperative.      UC Treatments / Results  Labs (all labs ordered are listed, but only abnormal results are displayed) Labs Reviewed - No data to display  EKG   Radiology DG Chest 2 View Result Date: 05/29/2024 CLINICAL DATA:  Cough and fever. EXAM: CHEST - 2 VIEW COMPARISON:  09/29/2022. FINDINGS: The heart size and mediastinal contours are within  normal limits. No consolidation, effusion, or pneumothorax is seen. No acute osseous abnormality. IMPRESSION: No active cardiopulmonary disease. Electronically Signed   By: Leita Birmingham M.D.   On: 05/29/2024 19:33    Procedures Procedures (including critical care time)  Medications Ordered in UC Medications  ipratropium-albuterol  (DUONEB) 0.5-2.5 (3) MG/3ML nebulizer solution 3 mL (3 mLs Nebulization Given 05/29/24 2001)  dexamethasone  (DECADRON ) injection 10 mg (10 mg Intramuscular Given 05/29/24 2002)    Initial Impression / Assessment and Plan / UC Course  I have reviewed the triage vital signs and the nursing notes.  Pertinent labs & imaging results that were available during my care of the patient were reviewed by me and considered in my medical decision making (see chart for details).     The patient presents with a progressively worsening upper respiratory infection that began on December 23rd and was initially evaluated two days ago with negative influenza and COVID testing. Since that visit, symptoms have significantly worsened, including a severe productive cough causing rib pain, postnasal drainage with gagging and choking sensations, persistent nasal congestion, fever with night sweats, nausea with inability to tolerate oral intake since starting medications, severe dry mouth, and mild exertional shortness of breath. Physical examination reveals clear lung sounds with even, unlabored respirations. Chest X-ray obtained today is negative for pneumonia or other acute cardiopulmonary pathology. Although a viral etiology remains most likely, the degree and progression of symptoms warrant escalation of treatment. In clinic, a nebulizer breathing treatment was administered to help loosen secretions, and a dexamethasone  injection was given to reduce airway inflammation. The patient was prescribed fluticasone  nasal spray, an albuterol  inhaler for use as needed, a short course of oral  prednisone , and azithromycin  given lack of clinical improvement. Supportive care measures were reinforced, including continued use of cough medications already purchased, humidifier use, and avoiding dry air. The patient was advised to follow up with her primary care provider if symptoms do not improve and to seek emergency care for worsening shortness of  breath, chest pain, persistent vomiting or inability to keep fluids down, high or persistent fever, dizziness, or any other concerning symptoms.  Today's evaluation has revealed no signs of a dangerous process. Discussed diagnosis with patient and/or guardian. Patient and/or guardian aware of their diagnosis, possible red flag symptoms to watch out for and need for close follow up. Patient and/or guardian understands verbal and written discharge instructions. Patient and/or guardian comfortable with plan and disposition.  Patient and/or guardian has a clear mental status at this time, good insight into illness (after discussion and teaching) and has clear judgment to make decisions regarding their care  Documentation was completed with the aid of voice recognition software. Transcription may contain typographical errors.  Final Clinical Impressions(s) / UC Diagnoses   Final diagnoses:  Acute cough  Upper respiratory tract infection, unspecified type     Discharge Instructions      You were seen today for worsening upper respiratory symptoms. Your chest X-ray is normal, and there are no signs of pneumonia. Your symptoms are most consistent with a significant upper respiratory infection that has not improved with time and supportive care alone.  To help with your symptoms, you were given a breathing treatment and a steroid injection today to reduce inflammation and help loosen mucus. Take the prescribed medications as directed, including the prednisone , azithromycin , Flonase  nasal spray, and albuterol  inhaler as needed for coughing or shortness of  breath. You may continue using the cough medications you have already. Use a humidifier at home and avoid dry air, as this can worsen congestion and throat irritation. Stay well hydrated with small, frequent sips of fluids if large amounts are difficult to tolerate. Replace your toothbrush after completing the antibiotic course.  Follow up with your primary care provider if your symptoms are not clearly improving over the next few days or if you continue to have difficulty eating or drinking. Seek emergency care immediately if you develop worsening shortness of breath, chest pain, persistent vomiting or inability to keep fluids down, confusion, fainting, or a fever that continues to rise or does not improve with medication.     ED Prescriptions     Medication Sig Dispense Auth. Provider   azithromycin  (ZITHROMAX ) 250 MG tablet Take 1 tablet (250 mg total) by mouth daily. Take first 2 tablets together, then 1 every day until finished. 6 tablet Iola Lukes, FNP   albuterol  (VENTOLIN  HFA) 108 (90 Base) MCG/ACT inhaler Inhale 2 puffs into the lungs every 4 (four) hours as needed for wheezing or shortness of breath (bronchospasms). 18 g Jonica Bickhart, FNP   fluticasone  (FLONASE ) 50 MCG/ACT nasal spray Place 2 sprays into both nostrils daily. Shake well before use. Gently blow nose before spraying. Do not blow nose immediately after use. You should not taste the medication or feel it going down your throat; if you do, adjust your technique. 16 g Iola Lukes, FNP   predniSONE  (DELTASONE ) 20 MG tablet Take 2 tablets (40 mg total) by mouth daily for 5 days. 10 tablet Iola Lukes, FNP      PDMP not reviewed this encounter.     [1]  Social History Tobacco Use   Smoking status: Never    Passive exposure: Never   Smokeless tobacco: Never  Vaping Use   Vaping status: Never Used  Substance Use Topics   Alcohol use: Never   Drug use: Never     Iola Lukes, FNP 05/29/24  2011  "

## 2024-05-29 NOTE — ED Triage Notes (Signed)
 PT seen on 12-26  and reports Sx's are worse. Pt still has a cough and a very dry throat. Pt reports she is chocking and can not swallow due to dry throat. Pt also reports her nose is completely blocked and can only breath out of he mouth. Pt has also developed rib and back pain from coughing.
